# Patient Record
Sex: Male | Born: 1935 | Race: White | Hispanic: No
Health system: Southern US, Community
[De-identification: ages and names within clinical notes are randomized; demographics above are authoritative.]

## PROBLEM LIST (undated history)

## (undated) DIAGNOSIS — K649 Unspecified hemorrhoids: Secondary | ICD-10-CM

## (undated) DIAGNOSIS — K219 Gastro-esophageal reflux disease without esophagitis: Secondary | ICD-10-CM

## (undated) DIAGNOSIS — D369 Benign neoplasm, unspecified site: Secondary | ICD-10-CM

## (undated) DIAGNOSIS — R001 Bradycardia, unspecified: Secondary | ICD-10-CM

## (undated) HISTORY — PX: CATARACT EXTRACTION: SUR2

## (undated) HISTORY — DX: Unspecified hemorrhoids: K64.9

## (undated) HISTORY — DX: Gastro-esophageal reflux disease without esophagitis: K21.9

## (undated) HISTORY — PX: APPENDECTOMY: SHX54

## (undated) HISTORY — DX: Benign neoplasm, unspecified site: D36.9

---

## 2002-01-06 HISTORY — PX: ESOPHAGOGASTRODUODENOSCOPY: SHX1529

## 2002-01-06 HISTORY — PX: COLONOSCOPY: SHX174

## 2002-10-31 ENCOUNTER — Ambulatory Visit (HOSPITAL_COMMUNITY): Admission: RE | Admit: 2002-10-31 | Discharge: 2002-10-31 | Payer: Self-pay | Admitting: Internal Medicine

## 2003-01-07 HISTORY — PX: ESOPHAGOGASTRODUODENOSCOPY: SHX1529

## 2003-01-30 ENCOUNTER — Ambulatory Visit (HOSPITAL_COMMUNITY): Admission: RE | Admit: 2003-01-30 | Discharge: 2003-01-30 | Payer: Self-pay | Admitting: Internal Medicine

## 2004-09-02 ENCOUNTER — Ambulatory Visit: Payer: Self-pay | Admitting: Internal Medicine

## 2006-09-02 ENCOUNTER — Ambulatory Visit: Payer: Self-pay | Admitting: Internal Medicine

## 2007-11-02 ENCOUNTER — Ambulatory Visit (HOSPITAL_COMMUNITY): Admission: RE | Admit: 2007-11-02 | Discharge: 2007-11-02 | Payer: Self-pay | Admitting: Ophthalmology

## 2007-11-16 ENCOUNTER — Ambulatory Visit (HOSPITAL_COMMUNITY): Admission: RE | Admit: 2007-11-16 | Discharge: 2007-11-16 | Payer: Self-pay | Admitting: Ophthalmology

## 2008-10-02 ENCOUNTER — Encounter: Payer: Self-pay | Admitting: Gastroenterology

## 2008-10-09 DIAGNOSIS — K219 Gastro-esophageal reflux disease without esophagitis: Secondary | ICD-10-CM

## 2008-10-09 DIAGNOSIS — I498 Other specified cardiac arrhythmias: Secondary | ICD-10-CM

## 2008-10-09 DIAGNOSIS — K319 Disease of stomach and duodenum, unspecified: Secondary | ICD-10-CM

## 2008-10-10 ENCOUNTER — Ambulatory Visit: Payer: Self-pay | Admitting: Internal Medicine

## 2008-11-06 ENCOUNTER — Emergency Department (HOSPITAL_COMMUNITY): Admission: EM | Admit: 2008-11-06 | Discharge: 2008-11-06 | Payer: Self-pay | Admitting: Emergency Medicine

## 2009-06-19 ENCOUNTER — Ambulatory Visit (HOSPITAL_COMMUNITY): Admission: RE | Admit: 2009-06-19 | Discharge: 2009-06-19 | Payer: Self-pay | Admitting: Ophthalmology

## 2009-07-03 ENCOUNTER — Ambulatory Visit (HOSPITAL_COMMUNITY): Admission: RE | Admit: 2009-07-03 | Discharge: 2009-07-03 | Payer: Self-pay | Admitting: Ophthalmology

## 2010-05-21 NOTE — Assessment & Plan Note (Signed)
[name], [name]                CHART#:  [id number]   DATE:  09/02/2006                       DOB:  Jan 06, 1933   REASON FOR VISIT:  Followup.   HISTORY OF PRESENT ILLNESS:  History of GERD, peptic stricture. Last EGD  with dilation 2004. The patient has done extremely well. He has been on  omeprazole 20 mg daily ever since and reflux symptoms well controlled  and he has not had any recurrent dysphagia. He had an inflammatory polyp  removed from his colon in 2004. He is not due for routine screening  until 2014. He is seeing Dr. Juanetta Gosling on a regular basis. He has  cataracts as his only major medical issue at this time and takes Ambien  for insomnia.   MEDICATIONS:  See updated list.   ALLERGIES:  NO KNOWN DRUG ALLERGIES.   PHYSICAL EXAMINATION:  GENERAL APPEARANCE: On exam today looks well.  VITAL SIGNS: Weight 171 which is down 1 pound, height 6 feet, temp 98.3,  BP 148/80, pulse 60.  SKIN: The skin is warm and dry.  CHEST: The lungs are clear to auscultation.  CARDIAC: Exam has a regular rate and rhythm without murmur, gallop or  rub.  ABDOMEN: The abdomen is nondistended, positive bowel sounds, soft,  nontender without appreciable mass or organomegaly.   ASSESSMENT:  History of gastroesophageal reflux disease with peptic  stricture. He has had long-lasting results from esophageal dilation in  2004, largely because he is staying on acid-suppression therapy.  Admonished him to continue acid-suppression therapy as uncontrolled  reflux is the nidus for recurrent stricture development. He may or may  not need a dilation in the future.   RECOMMENDATIONS:  I have refilled his omeprazole 20 mg tablets daily,  #90 for 2 years unless something comes up. Will touch base with him in 2  years.   ADDENDUM:  Colonoscopy in 2014.       Jonathon Bellows, M.D.  Electronically Signed     RMR/MEDQ  D:  09/02/2006  T:  09/02/2006  Job:  [id number]   cc:   Ramon Dredge L. Juanetta Gosling, M.D.

## 2010-05-24 NOTE — Op Note (Signed)
NAME:  [name], [name]                         ACCOUNT NO.:  [id number]   MEDICAL RECORD NO.:  [id number]                   PATIENT TYPE:  AMB   LOCATION:  DAY                                  FACILITY:  [facility]   PHYSICIAN:  R. Roetta Sessions, M.D.              DATE OF BIRTH:  [birth year]   DATE OF PROCEDURE:  01/30/2003  DATE OF DISCHARGE:                                 OPERATIVE REPORT   PROCEDURE:  Esophagogastroduodenoscopy with biopsy.   INDICATIONS:  The patient is a 77 year old gentleman with history of severe  reflux esophagitis with stricture requiring EGD with __________ dilation  back in October 2004.  He has done very well on Aciphex.  He has not had any  dysphagial reflux symptoms.  I feel he may well have had Barrett's  esophagus; however, biopsies of the distal esophagus were not confirmatory.  Because of the exuberance of the inflammation, I have decided to bring him  back, hopefully in the setting of much less inflammation, to get a better  look at his esophageal mucosa.  This approach has been discussed with the  patient at length.  The potential risks, benefits and alternatives have been  reviewed.  Please see my dictation for more information.   DESCRIPTION OF PROCEDURE:  Oxygen saturation, blood pressure, pulse and  respiration were monitored throughout the entire procedure.  Conscious  sedation with Versed 2 mg IV, Demerol 50 mg IV.  The instrument was the  Olympus __________ gastroscope.   FINDINGS:  Esophagus:  Examination of the tubular esophagus revealed a  couple of tiny distal esophageal erosions.  There was no Barrett's  esophagus, no evidence of neoplasm, ring or stricture.  The EG junction was  patulous, easily traversed.  Stomach:  The gastric cavity was empty and insufflated well with air.  Thorough examination of the gastric mucosa including the retroflexed view of  the proximal stomach and esophagogastric junction demonstrated evidence a  rather  large hiatal hernia and a 5 mm nodule in the cardia just distal to  the EG junction.  Please see photos.  Otherwise gastric mucosa appeared  normal.  Pylorus patent and easily traversed.  Examination of the bulb and  second portion revealed no abnormalities.   THERAPY AND DIAGNOSTIC MANEUVERS:  The 5 mm nodule in the proximal stomach  was cold biopsied/removed.  The patient tolerated the procedure well and was  reactive after endoscopy.   IMPRESSION:  1. Tiny distal esophageal erosions, a couple of very small pseudo     diverticula (not mentioned above) distal esophagus mucosa without any     evidence of Barrett's esophagus.  The esophageal mucosa looked much, much     better today without evidence of Barrett's esophagus.  2. Patulous esophagogastric junction.  3. Nodule in the cardia biopsied.  4. Large hiatal hernia.  5. The remainder of the stomach and D1 and D2 appeared normal.  RECOMMENDATIONS:  1. Continue Aciphex 20 mg orally daily indefinitely.  2. Anti-reflux measures emphasized.  3. Follow up on pathology.  4. Further recommendations to follow.      ___________________________________________                                            Jonathon Bellows, M.D.   RMR/MEDQ  D:  01/30/2003  T:  01/30/2003  Job:  [id number]   cc:   Ramon Dredge L. Juanetta Gosling, M.D.  [address]  [place]  [place] [address]  Fax: [phone]

## 2010-05-24 NOTE — Op Note (Signed)
NAME:  [name], [name]                         ACCOUNT NO.:  [id number]   MEDICAL RECORD NO.:  [id number]                   PATIENT TYPE:  AMB   LOCATION:  DAY                                  FACILITY:  [facility]   PHYSICIAN:  R. Roetta Sessions, M.D.              DATE OF BIRTH:  01-06-1933   DATE OF PROCEDURE:  10/31/2002  DATE OF DISCHARGE:                                 OPERATIVE REPORT   PROCEDURE:  EGD with Elease Hashimoto dilation followed by biopsy followed by  colonoscopy and snare polypectomy.   INDICATIONS FOR PROCEDURE:  The patient is a 77 year old gentleman with  esophageal dysphagia/early satiety, who was found to be Hemoccult positive  recently.  EGD is now being done to further evaluate his upper GI tracts  symptoms.  Colonoscopy is now being done for evaluation of Hemoccult-  positive stool.  It is notable through my office a CBC was entirely normal  as was his TSH at 1.72.  This approach has been discussed with the patient  previously.  Potential risks, benefits, and alternatives have been reviewed,  questions answered.  Please see my dictated consultation note for more  information.   PROCEDURE NOTE:  O2 saturations, blood pressure, pulse, and respirations  were monitored throughout the entirety of the procedure.   CONSCIOUS SEDATION BOTH PROCEDURES:  Demerol 75 mg IV, Versed 4 mg IV in  divided doses.   INSTRUMENT:  Olympus video chip adult gastroscope and colonoscope.   ESOPHAGOGASTRODUODENOSCOPY FINDINGS:  Examination of the tubular esophagus  revealed multiple long distal esophageal erosions superimposed on what  appeared to be long tongues of salmon-colored epithelium, extending 6-7 cm  up into the distal esophagus.  There was also a benign-appearing peptic  stricture.  Please see photos.  EG junction was easily traversed with the  scope.   STOMACH:  The gastric cavity was emptied, insufflated well with air.  Thorough examination of the gastric mucosa, including  retroflexed view of  the proximal stomach and esophagogastric junction demonstrated a moderate  size hiatal hernia.  Pylorus was patent and easily traversed.  Examination  of the bulb and second portion revealed no abnormalities.   THERAPY/DIAGNOSTIC MANEUVERS PERFORMED:  A 56 French Maloney dilator was passed to full insertion.  A look back  revealed the stricture had been dilated without apparent complication.  Biopsies of the salmon-colored epithelium were taken x 2 after passage of  the [facility] dilator.  The patient tolerated the procedure well and was  prepared for colonoscopy.  Digital rectal examination revealed no  abnormalities or endoscopic findings.  The prep was adequate.   RECTUM:  Examination of the rectal mucosa including retroflexed view in the  anal verge revealed no abnormalities.   COLON:  Colonic mucosa was surveyed from the rectosigmoid junction through  the left transverse and right colon to the area of the appendiceal orifice,  ileocecal valve, and cecum.  These  structures were well seen and  photographed for the record.  The patient was noted to have two 5 mm  pedunculated polyps in the right colon, one at the ileocecal valve, one in  the mid ascending colon.  These were cold snared and recovered.  For this  level, the scope was slowly withdrawn.  All previously-mentioned mucosal  surfaces were again seen, and no other abnormalities were observed.  The  patient tolerated both procedures well and was reacted in endoscopy.   IMPRESSION:   EGD:  1. Marked inflammatory changes of the distal esophagus as described above,     consistent with moderately severe erosive reflux esophagitis, tongues of     salmon-colored epithelium underlying the erosions, concerning for     Barrett's esophagus.  2. Peptic stricture, status post dilation as described above.  Distal     esophageal mucosa biopsied following Maloney dilation.  3. Moderate sized hiatal hernia.  4.  Otherwise, normal stomach, normal D1, D2.   COLONOSCOPY FINDINGS:  1. Normal rectum.  2. Small pedunculated polyps in the right colon, cold snared as described     above.  3. Remainder of colonic mucosa appeared normal.   ASSESSMENT/PLAN:  1. The patient has significant gastroesophageal reflux disease.  He needs to     be started on aggressive antiacid regimen in the way of Aciphex 20 mg     orally b.i.d. x 30 days, then drop back to once daily before breakfast     thereafter.  2. Literature on gastroesophageal reflux disease provided to Mr. Sokolov.  3. It was notable before and during the procedure on the cardiac monitor, it     was noted that his PR interval progressively increased over three beats     and then dropped.  This was repeated in sequence while he was on cardiac     monitoring, consistent with a Mobitz type I secondary degree     arteriovenous block.  He was bradycardic in the 40s-50s before and     throughout the procedure and remained asymptomatic.  I recommended he     follow up with Ramon Dredge AL. Juanetta Gosling, M.D. for whatever cardiology     evaluation is felt to be appropriate.  4. Follow up on path when available.  5. We will see this nice gentleman back in the office in three months.     Would give some consideration to taking another look at his esophagus in     three months.      ___________________________________________                                            [name], M.D.   RMR/MEDQ  D:  10/31/2002  T:  10/31/2002  Job:  [id number]   cc:   Ramon Dredge L. Juanetta Gosling, M.D.  [address]  [place]  [place] [address]  Fax: [phone]

## 2010-05-24 NOTE — H&P (Signed)
NAMEGAUDENCIO, CHESNUT                           ACCOUNT NO.:  [id number]   MEDICAL RECORD NO.:  [id number]                  PATIENT TYPE:   LOCATION:                                       FACILITY:   PHYSICIAN:  R. Roetta Sessions, M.D.              DATE OF BIRTH:  31-Jul-1932   DATE OF ADMISSION:  DATE OF DISCHARGE:                                HISTORY & PHYSICAL   CHIEF COMPLAINT:  Early satiety.   HISTORY OF PRESENT ILLNESS:  Mr. Monti Jilek is a 77 year old Caucasian  male who presents to our office for complaints of early satiety as well as  increased belching and flatulence for the last one to two years.  He reports  that he has a good appetite; however, when he starts to eat his meal he only  consumes half before feeling full.  He also reports he has decreased the  size and amount of his meals by about 50% his normal consumption in the last  two years.  He also reports that his weight is down about 8 pounds in the  past six months.  He denies any dysphagia, odynophagia, nausea, vomiting,  reflux, or abdominal pain.  He denies any hematochezia or melena.  He  reports that his bowel movements have been normal which is daily or every-  other day for him and that they are soft and brown.  He denies any problems  with constipation or diarrhea.  He does report that he has had an upper  respiratory infection for about two weeks or so with an associated cough.  However, he reports that this is improving.  He denies any fever or chills.   PAST MEDICAL HISTORY:  1. Bradycardia.  2. Appendectomy.   CURRENT MEDICATIONS:  Denies any.   ALLERGIES:  No known drug allergies.   FAMILY HISTORY:  No known family history of colorectal carcinoma, liver, or  chronic GI problems.  Mother is deceased in her 58s due to diabetes  mellitus.  Father is 91 and live in good health.  He has one sister and one  brother both of which are in good health.   SOCIAL HISTORY:  He has been married for  approximately 50 years and  currently lives with his wife.  He has one grown son who is in good health.  He is currently retired; however, he was a Curator for several years.  He  currently is a nonsmoker; however, he does report a 12-year history of one  to two packs per day which he quit in 1976.  He denies any alcohol or drug  use.   REVIEW OF SYSTEMS:  CONSTITUTIONAL:  See HPI.  He does report good appetite.  CARDIOVASCULAR:  He denies any chest pain or palpitations; however, he does  have history of bradycardia which he reports is essentially asymptomatic  although he does have bouts where his heart rate falls  between 40 and 50  beats per minute.  He is being followed by Dr. Juanetta Gosling for this.  PULMONARY:  He is complaining of a nagging cough that he just cannot get rid of;  however, he has had URI recently within the last week or so and reports that  this is getting somewhat better.  HEENT:  He does have history of being hard-  of-hearing.  GI:  See HPI.  SKIN:  He denies any rash or jaundice.   PHYSICAL EXAMINATION:  VITAL SIGNS:  Weight 156 pounds, height 72 inches,  temperature 98.6, blood pressure 110/80, pulse 80.  GENERAL:  Mr. Dimas Scheck is a 77 year old Caucasian male who is well-  developed, well-nourished, and in no acute distress.  He is alert, pleasant,  oriented, and cooperative.  HEENT:  Sclerae clear and nonicteric.  Conjunctivae pink.  Oropharynx pink  and moist without any lesions.  NECK:  Supple without any mass or thyromegaly.  No JVD.  CHEST:  Heart regular rate and rhythm without murmurs, clicks, rubs, or  gallops.  LUNGS:  There are rhonchi present in the left base; otherwise clear.  ABDOMEN:  Positive bowel sounds x4, soft, nontender, nondistended, with no  palpable organomegaly.  RECTAL:  There is no evidence of external hemorrhoids.  A small amount of  dark brown hard stool is obtained which is heme positive.  EXTREMITIES:  Show 2+ pedal pulses  bilaterally, no pedal edema.   ASSESSMENT:  1. Mr. Merle Cirelli is a 78 year old Caucasian male with worrisome symptoms     including early satiety as well as weight loss in the setting of heme     positive stools.  Given that he has not had history of screening     colonoscopy I discussed with Mr. Witham the fact that we need to pursue     colonoscopy at this point in time.  Also his symptoms of early satiety     associated with a small but persistent weight loss is worrisome and EGD     should be obtained as well to rule out any lesions such as esophageal     carcinoma or peptic ulcer disease.  I discussed both colonoscopy and EGD     with Mr. Charney and also reviewed risks and benefits which include but     are not limited to bleeding, perforation, and infection.  He agrees with     this plan.  Will schedule these procedures with Dr. Jena Gauss as soon as     possible.  2. Cough and upper respiratory symptoms.  He does have rhonchi in the left     base as well.  Will obtain CBC today as we would anyway for heme positive     stools and may need to proceed with chest x-ray if symptoms persist or if     he has leukocytosis.  Will encourage him to follow up with Dr. Juanetta Gosling as     well if symptoms persist.   RECOMMENDATIONS:  1. CBC and TSH today.  2. EGD and colonoscopy to be scheduled with Dr. Jena Gauss.  Consent will be     obtained.  3. Further recommendations following procedures.     _____________________________________  ___________________________________________  Nicholas Lose, N.P.               Jonathon Bellows, M.D.   KC/MEDQ  D:  10/28/2002  T:  10/28/2002  Job:  [id number]   cc:   Ramon Dredge L. Juanetta Gosling, M.D.  [address]  [place]  [place] [address]  Fax: [phone]

## 2010-07-19 ENCOUNTER — Other Ambulatory Visit: Payer: Self-pay | Admitting: Internal Medicine

## 2010-07-22 NOTE — Telephone Encounter (Signed)
Called in rx. Pt needs to be seen Oct 2012 for 2 year f/u.

## 2010-09-27 ENCOUNTER — Encounter: Payer: Self-pay | Admitting: Internal Medicine

## 2010-10-08 LAB — BASIC METABOLIC PANEL
BUN: 6
CO2: 27
Calcium: 9.1
Creatinine, Ser: 0.88
GFR calc non Af Amer: >60
Glucose, Bld: 88
Sodium: 141

## 2010-10-16 ENCOUNTER — Encounter: Payer: Self-pay | Admitting: Gastroenterology

## 2010-10-16 ENCOUNTER — Ambulatory Visit (INDEPENDENT_AMBULATORY_CARE_PROVIDER_SITE_OTHER): Payer: Medicare Other | Admitting: Gastroenterology

## 2010-10-16 VITALS — BP 126/63 | HR 68 | Temp 97.3°F | Ht 72.0 in | Wt 169.6 lb

## 2010-10-16 DIAGNOSIS — K219 Gastro-esophageal reflux disease without esophagitis: Secondary | ICD-10-CM

## 2010-10-16 MED ORDER — OMEPRAZOLE 20 MG PO CPDR: 20.0000 mg | DELAYED_RELEASE_CAPSULE | Freq: Every day | ORAL | Status: DC

## 2010-10-16 NOTE — Patient Instructions (Signed)
You will be due for your colonoscopy in 11/2012. We will call to make your next appointment around 10/2012.

## 2010-10-16 NOTE — Progress Notes (Signed)
Primary Care Physician: Fredirick Maudlin, MD  Primary Gastroenterologist:  Roetta Sessions, MD   Chief Complaint  Patient presents with  . Follow-up    HPI: [name] is a 77 y.o. male here for followup of GERD. He takes omeprazole 20 mg daily. Good control of heartburn. He tries not to over eat. Denies dysphagia, odynophagia, early satiety, abdominal pain, constipation, diarrhea, melena, rectal bleeding. Since we last saw him he had cataracts removed from both eyes. This is complicated by scar tissue which required laser surgery. He is pleased with his outcome. He now has hearing aids as well.  Current Outpatient Prescriptions  Medication Sig Dispense Refill  . omeprazole (PRILOSEC) 20 MG capsule TAKE 1 CAPSULE BY MOUTH EVERY MORNING.  31 capsule  3  . zolpidem (AMBIEN) 10 MG tablet         Allergies as of 10/16/2010  . (No Known Allergies)    ROS:  General: Negative for anorexia, weight loss, fever, chills, fatigue, weakness. ENT: Negative for hoarseness, difficulty swallowing , nasal congestion. CV: Negative for chest pain, angina, palpitations, dyspnea on exertion, peripheral edema.  Respiratory: Negative for dyspnea at rest, dyspnea on exertion, cough, sputum, wheezing.  GI: See history of present illness. GU:  Negative for dysuria, hematuria, urinary incontinence, urinary frequency, nocturnal urination.  Endo: Negative for unusual weight change.    Physical Examination:   BP 126/63  Pulse 68  Temp(Src) 97.3 F (36.3 C) (Temporal)  Ht 6' (1.829 m)  Wt 169 lb 9.6 oz (76.93 kg)  BMI 23.00 kg/m2  General: Well-nourished, well-developed in no acute distress.  Eyes: No icterus. Mouth: Oropharyngeal mucosa moist and pink , no lesions erythema or exudate. Lungs: Clear to auscultation bilaterally.  Heart: Regular rate and rhythm, no murmurs rubs or gallops.  Abdomen: Bowel sounds are normal, nontender, nondistended, no hepatosplenomegaly or masses, no abdominal bruits  or hernia , no rebound or guarding.   Extremities: No lower extremity edema. No clubbing or deformities. Neuro: Alert and oriented x 4   Skin: Warm and dry, no jaundice.   Psych: Alert and cooperative, normal mood and affect.

## 2010-10-16 NOTE — Assessment & Plan Note (Addendum)
Clinically asymptomatic on omeprazole 20 mg daily. Continue anti-reflex measures. Come back to be seen in 2 years. At that time we'll plan to schedule your colonoscopy. We will renew your omeprazole in one year when it comes time.

## 2010-10-16 NOTE — Progress Notes (Signed)
Cc to PCP 

## 2010-12-09 ENCOUNTER — Emergency Department (HOSPITAL_COMMUNITY)
Admission: EM | Admit: 2010-12-09 | Discharge: 2010-12-09 | Disposition: A | Discharge status: Home / Self Care | Payer: Medicare Other | Attending: Emergency Medicine | Admitting: Emergency Medicine

## 2010-12-09 ENCOUNTER — Encounter (HOSPITAL_COMMUNITY): Payer: Self-pay | Admitting: Emergency Medicine

## 2010-12-09 ENCOUNTER — Emergency Department (HOSPITAL_COMMUNITY): Payer: Medicare Other

## 2010-12-09 DIAGNOSIS — Z23 Encounter for immunization: Secondary | ICD-10-CM | POA: Insufficient documentation

## 2010-12-09 DIAGNOSIS — W230XXA Caught, crushed, jammed, or pinched between moving objects, initial encounter: Secondary | ICD-10-CM | POA: Insufficient documentation

## 2010-12-09 DIAGNOSIS — K219 Gastro-esophageal reflux disease without esophagitis: Secondary | ICD-10-CM | POA: Insufficient documentation

## 2010-12-09 DIAGNOSIS — Y92009 Unspecified place in unspecified non-institutional (private) residence as the place of occurrence of the external cause: Secondary | ICD-10-CM | POA: Insufficient documentation

## 2010-12-09 DIAGNOSIS — S62609B Fracture of unspecified phalanx of unspecified finger, initial encounter for open fracture: Secondary | ICD-10-CM | POA: Insufficient documentation

## 2010-12-09 HISTORY — DX: Bradycardia, unspecified: R00.1

## 2010-12-09 MED ORDER — HYDROCODONE-ACETAMINOPHEN 7.5-325 MG PO TABS: 1.0000 | ORAL_TABLET | ORAL | Status: DC | PRN

## 2010-12-09 MED ORDER — CEPHALEXIN 500 MG PO CAPS: 500.0000 mg | ORAL_CAPSULE | Freq: Four times a day (QID) | ORAL | Status: DC

## 2010-12-09 MED ORDER — BUPIVACAINE HCL (PF) 0.5 % IJ SOLN
30.0000 mL | Freq: Once | INTRAMUSCULAR | Status: AC
  Administered 2010-12-09: 30 mL
  Filled 2010-12-09: qty 30

## 2010-12-09 MED ORDER — TETANUS-DIPHTH-ACELL PERTUSSIS 5-2.5-18.5 LF-MCG/0.5 IM SUSP
0.5000 mL | Freq: Once | INTRAMUSCULAR | Status: AC
  Administered 2010-12-09: 0.5 mL via INTRAMUSCULAR
  Filled 2010-12-09: qty 0.5
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
## 2010-12-09 NOTE — ED Provider Notes (Signed)
History     CSN: [id number] Arrival date & time: 12/09/2010  9:53 AM   First MD Initiated Contact with Patient 12/09/10 1038      Chief Complaint  Patient presents with  . Hand Injury    (Consider location/radiation/quality/duration/timing/severity/associated sxs/prior treatment) Patient is a 77 y.o. male presenting with hand injury. The history is provided by the patient.  Hand Injury  The incident occurred 1 to 2 hours ago. The incident occurred at home. Injury mechanism: Pt was working on a hand crank motor at home and got his hand caught in a moving part. The pain is present in the right fingers. The quality of the pain is described as aching. The pain is moderate. The pain has been constant since the incident. Pertinent negatives include no fever and no malaise/fatigue. He reports no foreign bodies present. The symptoms are aggravated by movement. He has tried nothing for the symptoms. The treatment provided no relief.    Past Medical History  Diagnosis Date  . GERD (gastroesophageal reflux disease)   . Bradycardia     Past Surgical History  Procedure Date  . Appendectomy   . Esophagogastroduodenoscopy 2005    Dr. 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 Esophagogastroduodenoscopy 2004    peptic stricture s/p dilation, moderately severe reflux esophagitis  . Colonoscopy 2004    two inflammatory polyps, next TCS 11/2012  . Cataract extraction     bilateral    Family History  Problem Relation Age of Onset  . Heart disease Mother     deceased age 37    History  Substance Use Topics  . Smoking status: Never Smoker   . Smokeless tobacco: Not on file  . Alcohol Use: No      Review of Systems  Constitutional: Negative for fever, malaise/fatigue and activity change.       All ROS Neg except as noted in HPI  HENT: Negative for nosebleeds and neck pain.   Eyes: Negative for photophobia and discharge.  Respiratory: Negative for cough,  shortness of breath and wheezing.   Cardiovascular: Negative for chest pain and palpitations.  Gastrointestinal: Negative for abdominal pain and blood in stool.  Genitourinary: Negative for dysuria, frequency and hematuria.  Musculoskeletal: Negative for back pain and arthralgias.  Skin: Negative.   Neurological: Negative for dizziness, seizures and speech difficulty.  Psychiatric/Behavioral: Positive for sleep disturbance. Negative for hallucinations and confusion.    Allergies  Review of patient's allergies indicates no known allergies.  Home Medications   Current Outpatient Rx  Name Route Sig Dispense Refill  . OMEPRAZOLE 20 MG PO CPDR Oral Take 1 capsule (20 mg total) by mouth daily. 31 capsule 11  . ZOLPIDEM TARTRATE 10 MG PO TABS Oral Take 10 mg by mouth at bedtime as needed. For sleep      BP 127/82  Pulse 93  Temp 98.5 F (36.9 C)  Resp 20  Ht 6' (1.829 m)  Wt 175 lb (79.379 kg)  BMI 23.73 kg/m2  SpO2 98%  Physical Exam  Nursing note and vitals reviewed. Constitutional: He is oriented to person, place, and time. He appears well-developed and well-nourished.  Non-toxic appearance.  HENT:  Head: Normocephalic.  Right Ear: Tympanic membrane and external ear normal.  Left Ear: Tympanic membrane and external ear normal.  Eyes: EOM and lids are normal. Pupils are equal, round, and reactive to light.  Neck: Normal range of motion. Neck supple. Carotid bruit is not present.  Cardiovascular: Normal  rate, regular rhythm, normal heart sounds, intact distal pulses and normal pulses.   Pulmonary/Chest: Breath sounds normal. No respiratory distress.  Abdominal: Soft. Bowel sounds are normal. There is no tenderness. There is no guarding.  Musculoskeletal: Normal range of motion.       FROM of the right wrist, thumb, and 2nd finger. 1/2 of the nail of the the 3rd finger partially removed. Nail bed exposed. Some bleeding. No bone exposure.Marland Kitchen FROM of the 3rd finger. The nail of the  4th finger has been traumatically removed. FROM of the right 4th finger. Pain to palpation of the distal finger and attempted movement of the DIP. Laceration at the tip of the right 5th finger. Bleeding mostly controlled. No bone exposure or tendon involvement. Pain to palpation of the rt 5th distal portion of the finger.   Lymphadenopathy:       Head (right side): No submandibular adenopathy present.       Head (left side): No submandibular adenopathy present.    He has no cervical adenopathy.  Neurological: He is alert and oriented to person, place, and time. He has normal strength. No cranial nerve deficit or sensory deficit.  Skin: Skin is warm and dry.  Psychiatric: He has a normal mood and affect. His speech is normal.    ED Course  LACERATION REPAIR Performed by: Kathie Dike Authorized by: Kathie Dike Consent: Verbal consent obtained. Risks and benefits: risks, benefits and alternatives were discussed Consent given by: patient Patient understanding: patient states understanding of the procedure being performed Patient identity confirmed: verbally with patient Time out: Immediately prior to procedure a "time out" was called to verify the correct patient, procedure, equipment, support staff and site/side marked as required. Body area: upper extremity Location details: right small finger Laceration length: 1 cm Tendon involvement: none Nerve involvement: none Vascular damage: no Anesthesia: digital block Local anesthetic: bupivacaine 0.5% without epinephrine Patient sedated: no Preparation: Patient was prepped and draped in the usual sterile fashion. Irrigation solution: saline Amount of cleaning: standard Debridement: none Skin closure: 3-0 nylon Number of sutures: 2 Technique: simple Approximation: close Approximation difficulty: simple Dressing: 4x4 sterile gauze Patient tolerance: Patient tolerated the procedure well with no immediate complications. Comments:  Sterile Dressing applied by me.  NAIL REMOVAL Performed by: Kathie Dike Authorized by: Kathie Dike Consent: Verbal consent obtained. Risks and benefits: risks, benefits and alternatives were discussed Consent given by: patient Patient understanding: patient states understanding of the procedure being performed Patient identity confirmed: verbally with patient Time out: Immediately prior to procedure a "time out" was called to verify the correct patient, procedure, equipment, support staff and site/side marked as required. Location: right hand Location details: right long finger Anesthesia: digital block Local anesthetic: bupivacaine 0.5% without epinephrine Patient sedated: no Preparation: skin prepped with Betadine and sterile field established Amount removed: 1/2 Nail bed sutured: no Dressing: Xeroform gauze Patient tolerance: Patient tolerated the procedure well with no immediate complications. Comments: No significant laceration of the nail bed noted. Pt has a partial removal the the nail of the right 3rd finger. Non stick dressing applied by me.   (including critical care time) FRACTURE CARE:The pt was educated on the fractures of the distal portion of the 4th and 5th fingers. The fifth finger being considered open due to laceration in the fracture area. The right 4th and 5th fingers were buddy taped and splinted with cling and co-ban dressing by me.Pain control, antibiotic therapy and orthopedic out patient evaluation  discussed with the patient.  Labs Reviewed - No data to display Dg Hand Complete Right  12/09/2010  *RADIOLOGY REPORT*  Clinical Data: Trauma.  Distal phalanges caught crank motor  RIGHT HAND - COMPLETE 3+ VIEW  Comparison: None  Findings: There is a nondisplaced fracture deformity which extends to the tip of the tuft of the fifth distal phalanx.  On the AP and oblique radiographs there is a lucency which extends through the base of the tuft of the fourth  distal phalanx.  Soft tissue irregularity overlies the distal aspects of the third through fifth distal phalanges.  IMPRESSION:  1.  Tuftal fracture involves the fifth distal phalanx. 2.  Indeterminant lucency extending through the base of the tuft of the fourth distal phalanx.  Cannot rule out nondisplaced fracture.  Original Report Authenticated By: Rosealee Albee, M.D.     Dx:1. Open fracture of the distal right 5th finger. 2. Fracture of the distal right  4th finger. 3. Traumatic avulsion of the nail of the right 4th and 3rd finger.   MDM  I have reviewed nursing notes, vital signs, and all appropriate lab and imaging results for this patient.        Kathie Dike, [place] 12/09/10 [phone]

## 2010-12-10 ENCOUNTER — Ambulatory Visit (INDEPENDENT_AMBULATORY_CARE_PROVIDER_SITE_OTHER): Payer: Medicare Other | Admitting: Orthopedic Surgery

## 2010-12-10 ENCOUNTER — Encounter: Payer: Self-pay | Admitting: Orthopedic Surgery

## 2010-12-10 VITALS — BP 120/80 | Ht 72.0 in | Wt 168.0 lb

## 2010-12-10 DIAGNOSIS — S61209A Unspecified open wound of unspecified finger without damage to nail, initial encounter: Secondary | ICD-10-CM

## 2010-12-10 MED ORDER — HYDROCODONE-ACETAMINOPHEN 7.5-325 MG PO TABS: 1.0000 | ORAL_TABLET | ORAL | Status: DC | PRN

## 2010-12-10 NOTE — Progress Notes (Signed)
Chief complaint: Fingertip injuries RIGHT hand, small, ring, and long finger HPI:(4) Injury date December 2 secondary to fingers caught in a flywheel. Initial evaluation at the emergency room. X-ray showed a small fractures in the tips of the fingers, nondisplaced. Started on cephalexin and hydrocodone 7.5. Complains of 3-4/10 pain tends to come and go. Nothing is making a better at this time is worse if it gets up against something. He does feel some tingling.  ROS:(2) His eyes were water is other 13 systems are normal  PFSH: (1) History of appendectomy eye implants. Denies any major medical problems  Physical Exam(12) GENERAL: normal development   CDV: pulses are normal   Skin: normal  Lymph: nodes were not palpable/normal  Psychiatric: awake, alert and oriented  Neuro: normal sensation  MSK Ambulation without assistance 1 RIGHT hand after dressings are removed. We note the ring finger has 2 stitches in the nail. No deformity. Normal range of motion. 2 RIGHT ring finger nail completely avulsed. The nailbed normal. Range of motion normal. 3 RIGHT long finger partial nail avulsed. Alignment normal. Joint motion normal.   Imaging: The films were done at the hospital. They show small fractures of the distal phalanges of the involved digit  Assessment: Assessment is nail tip injuries. Plan dressing changes, antibiotics    Plan: Reevaluate in one week.

## 2010-12-10 NOTE — Patient Instructions (Signed)
Change dressing daily   Apply neosporin 3 x a day to small finger

## 2010-12-10 NOTE — ED Provider Notes (Signed)
Medical screening examination/treatment/procedure(s) were performed by non-physician practitioner and as supervising physician I was immediately available for consultation/collaboration.  Nicoletta Dress. Colon Branch, MD 12/10/10 [phone]

## 2010-12-17 ENCOUNTER — Encounter: Payer: Self-pay | Admitting: Orthopedic Surgery

## 2010-12-17 ENCOUNTER — Ambulatory Visit (INDEPENDENT_AMBULATORY_CARE_PROVIDER_SITE_OTHER): Payer: Medicare Other | Admitting: Orthopedic Surgery

## 2010-12-17 VITALS — BP 100/62 | Ht 72.0 in | Wt 169.0 lb

## 2010-12-17 DIAGNOSIS — S61209A Unspecified open wound of unspecified finger without damage to nail, initial encounter: Secondary | ICD-10-CM

## 2010-12-17 MED ORDER — HYDROCODONE-ACETAMINOPHEN 7.5-325 MG PO TABS: 1.0000 | ORAL_TABLET | ORAL | Status: AC | PRN

## 2010-12-17 NOTE — Progress Notes (Signed)
Routine scheduled followup  Previous history: Injury date December 2 secondary to fingers caught in a flywheel. Initial evaluation at the emergency room. X-ray showed a small fractures in the tips of the fingers, nondisplaced. Started on cephalexin and hydrocodone 7.5. Complains of 3-4/10 pain tends to come and go. Nothing is making a better at this time is worse if it gets up against something. He does feel some tingling.  Wound is clean and all fingers  He does compare some pain in the ring finger  Recommended continued dressing changes followup in several weeks to check on the progression of the nail and healing of the injuries.

## 2010-12-17 NOTE — Patient Instructions (Signed)
Keep covered until nail grow back

## 2011-01-23 ENCOUNTER — Ambulatory Visit: Payer: Medicare Other | Admitting: Orthopedic Surgery

## 2011-10-07 ENCOUNTER — Other Ambulatory Visit: Payer: Self-pay | Admitting: Internal Medicine

## 2011-10-07 NOTE — Telephone Encounter (Signed)
Pt came to front window asking if we could call in his RF of Omeprazole20 mg to [facility] and to have them deliver it to his house today or tomorrow. He does not want to use [facility]. Pt is on recall to follow up with [facility] next Sept 2014

## 2012-01-27 ENCOUNTER — Ambulatory Visit (INDEPENDENT_AMBULATORY_CARE_PROVIDER_SITE_OTHER): Payer: Medicare Other | Admitting: Gastroenterology

## 2012-01-27 ENCOUNTER — Encounter: Payer: Self-pay | Admitting: Gastroenterology

## 2012-01-27 VITALS — BP 118/70 | HR 84 | Temp 97.4°F | Ht 72.0 in | Wt 158.8 lb

## 2012-01-27 DIAGNOSIS — K219 Gastro-esophageal reflux disease without esophagitis: Secondary | ICD-10-CM

## 2012-01-27 DIAGNOSIS — Z1211 Encounter for screening for malignant neoplasm of colon: Secondary | ICD-10-CM

## 2012-01-27 MED ORDER — PANTOPRAZOLE SODIUM 20 MG PO TBEC: 40.0000 mg | DELAYED_RELEASE_TABLET | Freq: Two times a day (BID) | ORAL | Status: DC

## 2012-01-27 NOTE — Patient Instructions (Addendum)
Stop Prilosec.  Start taking Protonix twice a day. Take this 30 minutes before breakfast and 30 minutes before dinner.   Call us in about a week. If you are not improved, we will need to proceed with an upper endoscopy to look at your esophagus and stomach.   Please review the reflux diet in the meantime.  Diet for Gastroesophageal Reflux Disease, Adult Reflux (acid reflux) is when acid from your stomach flows up into the esophagus. When acid comes in contact with the esophagus, the acid causes irritation and soreness (inflammation) in the esophagus. When reflux happens often or so severely that it causes damage to the esophagus, it is called gastroesophageal reflux disease (GERD). Nutrition therapy can help ease the discomfort of GERD. FOODS OR DRINKS TO AVOID OR LIMIT  Smoking or chewing tobacco. Nicotine is one of the most potent stimulants to acid production in the gastrointestinal tract.   Caffeinated and decaffeinated coffee and black tea.   Regular or low-calorie carbonated beverages or energy drinks (caffeine-free carbonated beverages are allowed).     Strong spices, such as black pepper, white pepper, red pepper, cayenne, curry powder, and chili powder.   Peppermint or spearmint.   Chocolate.   High-fat foods, including meats and fried foods. Extra added fats including oils, butter, salad dressings, and nuts. Limit these to less than 8 tsp per day.   Fruits and vegetables if they are not tolerated, such as citrus fruits or tomatoes.   Alcohol.   Any food that seems to aggravate your condition.  If you have questions regarding your diet, call your caregiver or a registered dietitian. OTHER THINGS THAT MAY HELP GERD INCLUDE:    Eating your meals slowly, in a relaxed setting.   Eating 5 to 6 small meals per day instead of 3 large meals.   Eliminating food for a period of time if it causes distress.   Not lying down until 3 hours after eating a meal.   Keeping the head  of your bed raised 6 to 9 inches (15 to 23 cm) by using a foam wedge or blocks under the legs of the bed. Lying flat may make symptoms worse.   Being physically active. Weight loss may be helpful in reducing reflux in overweight or obese adults.   Wear loose fitting clothing  EXAMPLE MEAL PLAN This meal plan is approximately 2,000 calories based on https://www.[facility].org/ meal planning guidelines. Breakfast   cup cooked oatmeal.   1 cup strawberries.   1 cup low-fat milk.   1 oz almonds.  Snack  1 cup cucumber slices.   6 oz yogurt (made from low-fat or fat-free milk).  Lunch  2 slice whole-wheat bread.   2 oz sliced [place].   2 tsp mayonnaise.   1 cup blueberries.   1 cup snap peas.  Snack  6 whole-wheat crackers.   1 oz string cheese.  Dinner   cup brown rice.   1 cup mixed veggies.   1 tsp olive oil.   3 oz grilled fish.  Document Released: 12/23/2004 Document Revised: 03/17/2011 Document Reviewed: 11/08/2010 [facility] Patient Information 2013 [place], [place].

## 2012-01-27 NOTE — Progress Notes (Signed)
Referring Provider: Fredirick Maudlin, MD Primary Care Physician:  Fredirick Maudlin, MD Primary Gastroenterologist: Dr. Jena Gauss   Chief Complaint  Patient presents with  . Follow-up    HPI:   Pleasant 79 year old male who presents today secondary to GERD. Last seen in October 2012.  Taking Prilosec TID instead of once daily. States cough drops help as well. States helps to coat his throat. States it feels like food wants to back up, but it is not as bad as in the past. Points to throat, stating "something upside down". Improved. Feels heated in esophagus. No odynophagia. No N/V. No abx recently. No steroids. +early satiety, not eating as much for a few days, since this episode started.   Last EGD in 2005 with Dr. Jena Gauss; esophagitis and leiomyoma, large hiatal hernia. Hx of peptic stricture, s/p dilation in 2004.   Past Medical History  Diagnosis Date  . GERD (gastroesophageal reflux disease)   . Bradycardia     Past Surgical History  Procedure Date  . Appendectomy   . Esophagogastroduodenoscopy 2005    Dr. 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 Esophagogastroduodenoscopy 2004    peptic stricture s/p dilation, moderately severe reflux esophagitis  . Colonoscopy 2004    two inflammatory polyps, next TCS 11/2012  . Cataract extraction     bilateral    Current Outpatient Prescriptions  Medication Sig Dispense Refill  . omeprazole (PRILOSEC) 20 MG capsule Take 1 capsule (20 mg total) by mouth daily.  31 capsule  11  . zolpidem (AMBIEN) 10 MG tablet Take 10 mg by mouth at bedtime as needed. For sleep        Allergies as of 01/27/2012  . (No Known Allergies)    Family History  Problem Relation Age of Onset  . Heart disease Mother     deceased age 56  . Diabetes      History   Social History  . Marital Status: Married    Spouse Name: N/A    Number of Children: N/A  . Years of Education: N/A   Occupational History  . retired     Social History Main Topics  . Smoking status: Never Smoker   . Smokeless tobacco: None  . Alcohol Use: No  . Drug Use: No  . Sexually Active: None   Other Topics Concern  . None   Social History Narrative  . None    Review of Systems: Gen: SEE HPI CV: Denies chest pain, palpitations, syncope, peripheral edema, and claudication. Resp: Denies dyspnea at rest, cough, wheezing, coughing up blood, and pleurisy. GI: SEE HPI Derm: Denies rash, itching, dry skin Psych: Denies depression, anxiety, memory loss, confusion. No homicidal or suicidal ideation.  Heme: Denies bruising, bleeding, and enlarged lymph nodes.  Physical Exam: BP 118/70  Pulse 84  Temp 97.4 F (36.3 C) (Oral)  Ht 6' (1.829 m)  Wt 158 lb 12.8 oz (72.031 kg)  BMI 21.54 kg/m2 General:   Alert and oriented. No distress noted. Pleasant and cooperative.  Head:  Normocephalic and atraumatic. Eyes:  Conjuctiva clear without scleral icterus. EARS: HOH Mouth:  Oral mucosa pink and moist. Good dentition. No lesions. Neck:  Supple, without mass or thyromegaly. Heart:  S1, S2 present without murmurs, rubs, or gallops. Regular rate and rhythm. Abdomen:  +BS, soft, non-tender and non-distended. No rebound or guarding. No HSM or masses noted. Msk:  Symmetrical without gross deformities. Normal posture. Extremities:  Without edema. Neurologic:  Alert and  oriented  x4;  grossly normal neurologically. Skin:  Intact without significant lesions or rashes. Cervical Nodes:  No significant cervical adenopathy. Psych:  Alert and cooperative. Normal mood and affect.

## 2012-01-28 ENCOUNTER — Other Ambulatory Visit: Payer: Self-pay | Admitting: Gastroenterology

## 2012-01-28 ENCOUNTER — Telehealth: Payer: Self-pay | Admitting: Gastroenterology

## 2012-01-28 MED ORDER — PANTOPRAZOLE SODIUM 40 MG PO TBEC: 40.0000 mg | DELAYED_RELEASE_TABLET | Freq: Two times a day (BID) | ORAL | Status: DC

## 2012-01-28 MED ORDER — PANTOPRAZOLE SODIUM 20 MG PO TBEC: 40.0000 mg | DELAYED_RELEASE_TABLET | Freq: Two times a day (BID) | ORAL | Status: DC

## 2012-01-28 NOTE — Telephone Encounter (Signed)
Tried to call pharmacy, phone number was busy. Called pt and informed him that we fixed rx and sent it again. It was in epic as an inpatient rx.  He said he would check with the pharmacy.

## 2012-01-28 NOTE — Telephone Encounter (Signed)
Pt called to say that [facility] in [place] has not received his prescription and could we check on that and call him back to let him know. [phone] Pt was seen yesterday by AS

## 2012-01-28 NOTE — Telephone Encounter (Signed)
It looks like it went through. Can we check on this? I sent it again.

## 2012-01-29 DIAGNOSIS — Z1211 Encounter for screening for malignant neoplasm of colon: Secondary | ICD-10-CM | POA: Insufficient documentation

## 2012-01-29 DIAGNOSIS — K219 Gastro-esophageal reflux disease without esophagitis: Secondary | ICD-10-CM | POA: Insufficient documentation

## 2012-01-29 NOTE — Progress Notes (Signed)
Faxed to PCP

## 2012-01-29 NOTE — Assessment & Plan Note (Signed)
79 year old male with worsening GERD, increasing Prilosec to three times a day in the last week. Questionable dysphagia. Hx of peptic stricture in 2004, last EGD in 2005 with esophagitis. I have changed his PPI to Protonix BID to see if any improvement. I have discussed the possibility of an upper endoscopy +/- dilation if necessary in the near future. He is to call us in 1 week with a progress report. If no improvement, proceed with EGD.

## 2012-01-29 NOTE — Assessment & Plan Note (Signed)
Due for colonoscopy in Nov 2014

## 2012-01-30 ENCOUNTER — Ambulatory Visit: Payer: Medicare Other | Admitting: Internal Medicine

## 2012-02-03 ENCOUNTER — Telehealth: Payer: Self-pay

## 2012-02-03 NOTE — Telephone Encounter (Signed)
Per Gerrit Halls, NP, who saw pt last week on 01/27/2012. I called to check on the patient to see how he is doing. He said he is doing better. He is taking the Protonix bid. He is not having difficulty swallowing. Sometimes it feels like food does back a little but he is much better. He does seem that he gets full quicker. I told him we will call back when Theressa Millard this info.

## 2012-02-04 NOTE — Telephone Encounter (Signed)
Called pt and he is scheduled for OV with AS on 03/03/2012 at 1:30 PM.

## 2012-02-04 NOTE — Telephone Encounter (Signed)
I'm glad to hear he is improved. I'd like to see him in about 4-6 weeks. i want to make sure his weight is stable and that he is continuing to do well. IF any dysphagia, he will need EGD/ED. Hx of peptic stricture.

## 2012-03-03 ENCOUNTER — Encounter: Payer: Self-pay | Admitting: Gastroenterology

## 2012-03-03 ENCOUNTER — Ambulatory Visit (INDEPENDENT_AMBULATORY_CARE_PROVIDER_SITE_OTHER): Payer: Medicare Other | Admitting: Gastroenterology

## 2012-03-03 VITALS — BP 125/80 | HR 85 | Temp 97.3°F | Ht 68.0 in | Wt 165.2 lb

## 2012-03-03 DIAGNOSIS — K219 Gastro-esophageal reflux disease without esophagitis: Secondary | ICD-10-CM

## 2012-03-03 MED ORDER — OMEPRAZOLE 20 MG PO CPDR: 20.0000 mg | DELAYED_RELEASE_CAPSULE | Freq: Every day | ORAL | Status: DC

## 2012-03-03 NOTE — Progress Notes (Signed)
Referring Provider: Fredirick Maudlin, MD Primary Care Physician:  Fredirick Maudlin, MD Primary Gastroenterologist: Dr. Jena Gauss  Chief Complaint  Patient presents with  . Follow-up    HPI:   [name] returns today in follow-up for GERD and dysphagia. At his last appointment late Jan 2014, I changed him from Prilosec to Protonix BID. He had vague symptoms of dysphagia. Question of perhaps uncontrolled GERD. Last EGD in 2005 with Dr. Jena Gauss; esophagitis and leiomyoma, large hiatal hernia. Hx of peptic stricture, s/p dilation in 2004. He is due for surveillance colonoscopy in Nov 2014. He contacted [place] with a progress report about a week after his last visit, and he had improved with switching PPIs. Presents today stating the Protonix BID "burned him up". He went back to Prilosec BID and states his symptoms are completely improved. Denies dysphagia. Ate pinto beans, salad, corn bread and burped a few times otherwise was fine. Burping after each meal. States chronic. No nausea. Appetite good. "eats too much". States he saw blood in his urine after taking Protonix. No urinary symptoms, everything back to normal. States he has cut out pepsi.   Past Medical History  Diagnosis Date  . GERD (gastroesophageal reflux disease)   . Bradycardia     Past Surgical History  Procedure Laterality Date  . Appendectomy    . Esophagogastroduodenoscopy  2005    Dr. 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 Esophagogastroduodenoscopy  2004    peptic stricture s/p dilation, moderately severe reflux esophagitis  . Colonoscopy  2004    two inflammatory polyps, next TCS 11/2012  . Cataract extraction      bilateral    Current Outpatient Prescriptions  Medication Sig Dispense Refill  . omeprazole (PRILOSEC) 20 MG capsule Take 1 capsule (20 mg total) by mouth daily.  60 capsule  11  . zolpidem (AMBIEN) 10 MG tablet Take 10 mg by mouth at bedtime as needed. For sleep       No current  facility-administered medications for this visit.    Allergies as of 03/03/2012  . (No Known Allergies)    Family History  Problem Relation Age of Onset  . Heart disease Mother     deceased age 59  . Diabetes Mother   . Colon cancer Neg Hx     History   Social History  . Marital Status: Married    Spouse Name: N/A    Number of Children: N/A  . Years of Education: N/A   Occupational History  . retired    Social History Main Topics  . Smoking status: Never Smoker   . Smokeless tobacco: None  . Alcohol Use: No  . Drug Use: No  . Sexually Active: None   Other Topics Concern  . None   Social History Narrative  . None    Review of Systems: Negative unless mentioned in HPI  Physical Exam: BP 125/80  Pulse 85  Temp(Src) 97.3 F (36.3 C) (Oral)  Ht 5\' 8"  (1.727 m)  Wt 165 lb 3.2 oz (74.934 kg)  BMI 25.12 kg/m2 General:   Alert and oriented. No distress noted. Pleasant and cooperative.  Head:  Normocephalic and atraumatic. Eyes:  Conjuctiva clear without scleral icterus. Mouth:  Oral mucosa pink and moist. Good dentition. No lesions. Heart:  S1, S2 present without murmurs, rubs, or gallops. Regular rate and rhythm. Abdomen:  +BS, soft, non-tender and non-distended. No rebound or guarding. No HSM or masses noted. Msk:  Symmetrical without gross  deformities. Normal posture. Extremities:  Without edema. Neurologic:  Alert and  oriented x4;  grossly normal neurologically. Skin:  Intact without significant lesions or rashes. Psych:  Alert and cooperative. Normal mood and affect.

## 2012-03-03 NOTE — Assessment & Plan Note (Signed)
79 year old male with history of GERD, recent aggravation of symptoms but now back to baseline. He did not tolerate Protonix BID and actually went back to Prilosec BID. He states he has also cut out Pepsi, which probably was a large contributor to his symptoms. Dysphagia resolved. He does have a history of a peptic stricture. I discussed with him the importance of contacting us if any further issues. Otherwise, continue Prilosec BID, return in October 2014 to set up routine surveillance colonoscopy with Dr. Jena Gauss.  GERD handout diet provided. He will also be seeing Dr. Juanetta Gosling in the near future. I have asked him to discuss his recent gross blood in urine; this has resolved at the time of this visit, no urinary symptoms.

## 2012-03-03 NOTE — Patient Instructions (Addendum)
Continue to take Prilosec twice a day, 30 minutes before breakfast and supper.  Review the reflux diet. Avoid Pepsi and continue to drink that good ice water!   We will see you in October to set up your colonoscopy.   Diet for Gastroesophageal Reflux Disease, Adult Reflux (acid reflux) is when acid from your stomach flows up into the esophagus. When acid comes in contact with the esophagus, the acid causes irritation and soreness (inflammation) in the esophagus. When reflux happens often or so severely that it causes damage to the esophagus, it is called gastroesophageal reflux disease (GERD). Nutrition therapy can help ease the discomfort of GERD. FOODS OR DRINKS TO AVOID OR LIMIT  Smoking or chewing tobacco. Nicotine is one of the most potent stimulants to acid production in the gastrointestinal tract.  Caffeinated and decaffeinated coffee and black tea.  Regular or low-calorie carbonated beverages or energy drinks (caffeine-free carbonated beverages are allowed).   Strong spices, such as black pepper, white pepper, red pepper, cayenne, curry powder, and chili powder.  Peppermint or spearmint.  Chocolate.  High-fat foods, including meats and fried foods. Extra added fats including oils, butter, salad dressings, and nuts. Limit these to less than 8 tsp per day.  Fruits and vegetables if they are not tolerated, such as citrus fruits or tomatoes.  Alcohol.  Any food that seems to aggravate your condition. If you have questions regarding your diet, call your caregiver or a registered dietitian. OTHER THINGS THAT MAY HELP GERD INCLUDE:   Eating your meals slowly, in a relaxed setting.  Eating 5 to 6 small meals per day instead of 3 large meals.  Eliminating food for a period of time if it causes distress.  Not lying down until 3 hours after eating a meal.  Keeping the head of your bed raised 6 to 9 inches (15 to 23 cm) by using a foam wedge or blocks under the legs of the bed.  Lying flat may make symptoms worse.  Being physically active. Weight loss may be helpful in reducing reflux in overweight or obese adults.  Wear loose fitting clothing EXAMPLE MEAL PLAN This meal plan is approximately 2,000 calories based on https://www.bernard.org/ meal planning guidelines. Breakfast   cup cooked oatmeal.  1 cup strawberries.  1 cup low-fat milk.  1 oz almonds. Snack  1 cup cucumber slices.  6 oz yogurt (made from low-fat or fat-free milk). Lunch  2 slice whole-wheat bread.  2 oz sliced [place].  2 tsp mayonnaise.  1 cup blueberries.  1 cup snap peas. Snack  6 whole-wheat crackers.  1 oz string cheese. Dinner   cup brown rice.  1 cup mixed veggies.  1 tsp olive oil.  3 oz grilled fish. Document Released: 12/23/2004 Document Revised: 03/17/2011 Document Reviewed: 11/08/2010 [facility] Patient Information 2013 [place], [place].

## 2012-03-03 NOTE — Progress Notes (Signed)
Faxed to PCP

## 2012-06-12 ENCOUNTER — Emergency Department (HOSPITAL_COMMUNITY)
Admission: EM | Admit: 2012-06-12 | Discharge: 2012-06-12 | Disposition: A | Discharge status: Home / Self Care | Payer: Medicare Other | Attending: Emergency Medicine | Admitting: Emergency Medicine

## 2012-06-12 ENCOUNTER — Encounter (HOSPITAL_COMMUNITY): Payer: Self-pay | Admitting: *Deleted

## 2012-06-12 DIAGNOSIS — F4320 Adjustment disorder, unspecified: Secondary | ICD-10-CM | POA: Insufficient documentation

## 2012-06-12 DIAGNOSIS — K219 Gastro-esophageal reflux disease without esophagitis: Secondary | ICD-10-CM | POA: Insufficient documentation

## 2012-06-12 DIAGNOSIS — Z79899 Other long term (current) drug therapy: Secondary | ICD-10-CM | POA: Insufficient documentation

## 2012-06-12 DIAGNOSIS — Z8679 Personal history of other diseases of the circulatory system: Secondary | ICD-10-CM | POA: Insufficient documentation

## 2012-06-12 LAB — CBC WITH DIFFERENTIAL/PLATELET
Basophils Relative: 0 % (ref 0–1)
Eosinophils Absolute: 0.1 10*3/uL (ref 0.0–0.7)
Eosinophils Relative: 1 % (ref 0–5)
Lymphs Abs: 1.2 10*3/uL (ref 0.7–4.0)
MCH: 31.9 pg (ref 26.0–34.0)
MCHC: 33.9 g/dL (ref 30.0–36.0)
MCV: 94 fL (ref 78.0–100.0)
Neutrophils Relative %: 69 % (ref 43–77)
Platelets: 174 10*3/uL (ref 150–400)
RBC: 4.86 MIL/uL (ref 4.22–5.81)
RDW: 13.1 % (ref 11.5–15.5)

## 2012-06-12 LAB — RAPID URINE DRUG SCREEN, HOSP PERFORMED
Amphetamines: NOT DETECTED
Opiates: NOT DETECTED
Tetrahydrocannabinol: NOT DETECTED

## 2012-06-12 LAB — BASIC METABOLIC PANEL
BUN: 15 mg/dL (ref 6–23)
CO2: 29 mEq/L (ref 19–32)
Calcium: 10.1 mg/dL (ref 8.4–10.5)
Creatinine, Ser: 1.03 mg/dL (ref 0.50–1.35)
GFR calc non Af Amer: 68 mL/min — ABNORMAL LOW (ref >90)
Glucose, Bld: 100 mg/dL — ABNORMAL HIGH (ref 70–99)
Sodium: 139 mEq/L (ref 135–145)

## 2012-06-12 LAB — ETHANOL: Alcohol, Ethyl (B): <11 mg/dL (ref 0–11)

## 2012-06-12 NOTE — ED Notes (Signed)
Per IVC papers - pt's wife took out a restraining order " a few wks ago", wife currently across street from pt.  Papers states that pt has been walking up the streets at night and taking garbage out "naked."  Pt states he takes trash out without a shirt on.  Papers states wife feels pt is developing dementia.  Pt is alert and oriented x 4 at this time, calm, cooperative.

## 2012-06-12 NOTE — ED Provider Notes (Signed)
History    This chart was scribed for Donnetta Hutching, MD by Sofie Rower, ED Scribe. The patient was seen in room APA17/APA17 and the patient's care was started at 11:35AM.    CSN: [id number]  Arrival date & time 06/12/12  1028   First MD Initiated Contact with Patient 06/12/12 1135      Chief Complaint  Patient presents with  . V70.1    (Consider location/radiation/quality/duration/timing/severity/associated sxs/prior treatment) The history is provided by the patient and the police. No language interpreter was used.    Dequan Kindred Xxxpuckett is a 79 y.o. male , with a hx of GERD, bradycardia, appendectomy, and colonoscopy, who presents to the Emergency Department complaining of V70.1, onset today (06/12/12). The pt reports his wife has taken papers out on him due to his recent walking up and down the road, taking out the trash, and conversing about events which happened in the 1950's. Furthermore, the pt informs he has been walking down the street at 9:00PM and taking the trash out between 8:00AM-12:00 noon. [employer] office reports the pt has recently underwent divorce proceedings with his wife with whom he had been married for the past 60 years. His wife has taken 50B papers out on him which he believes to be unnecessary.  The pt does not smoke or drink alcohol.   PCP is Dr. Juanetta Gosling.    Past Medical History  Diagnosis Date  . GERD (gastroesophageal reflux disease)   . Bradycardia     Past Surgical History  Procedure Laterality Date  . Appendectomy    . Esophagogastroduodenoscopy  2005    Dr. 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 Esophagogastroduodenoscopy  2004    peptic stricture s/p dilation, moderately severe reflux esophagitis  . Colonoscopy  2004    two inflammatory polyps, next TCS 11/2012  . Cataract extraction      bilateral    Family History  Problem Relation Age of Onset  . Heart disease Mother     deceased age 85  .  Diabetes Mother   . Colon cancer Neg Hx     History  Substance Use Topics  . Smoking status: Never Smoker   . Smokeless tobacco: Not on file  . Alcohol Use: No      Review of Systems  All other systems reviewed and are negative.    Allergies  Review of patient's allergies indicates no known allergies.  Home Medications   Current Outpatient Rx  Name  Route  Sig  Dispense  Refill  . doxepin (SINEQUAN) 25 MG capsule   Oral   Take 25 mg by mouth at bedtime.         Marland Kitchen omeprazole (PRILOSEC) 20 MG capsule   Oral   Take 1 capsule (20 mg total) by mouth daily.   60 capsule   11     BP 128/89  Pulse 91  Temp(Src) 97.6 F (36.4 C) (Oral)  Resp 20  SpO2 98%  Physical Exam  Nursing note and vitals reviewed. Constitutional: He is oriented to person, place, and time. He appears well-developed and well-nourished.  HENT:  Head: Normocephalic and atraumatic.  Eyes: Conjunctivae and EOM are normal. Pupils are equal, round, and reactive to light.  Neck: Normal range of motion. Neck supple.  Cardiovascular: Normal rate, regular rhythm and normal heart sounds.   Pulmonary/Chest: Effort normal and breath sounds normal.  Abdominal: Soft. Bowel sounds are normal.  Musculoskeletal: Normal range of motion.  Neurological:  He is alert and oriented to person, place, and time.  Skin: Skin is warm and dry.  Psychiatric: He has a normal mood and affect.    ED Course  Procedures (including critical care time)  DIAGNOSTIC STUDIES: Oxygen Saturation is 98% on room air, normal by my interpretation.    COORDINATION OF CARE:  11:56 AM- Treatment plan discussed with patient. Pt agrees with treatment.      Labs Reviewed  BASIC METABOLIC PANEL - Abnormal; Notable for the following:    Glucose, Bld 100 (*)    GFR calc non Af Amer 68 (*)    GFR calc Af Amer 79 (*)    All other components within normal limits  CBC WITH DIFFERENTIAL  ETHANOL  URINE RAPID DRUG SCREEN (HOSP  PERFORMED)   No results found.   No diagnosis found.    MDM  No suicidal or homicidal ideation.  No evidence of psychosis.   Patient does not desire psychiatric counseling.  We'll discharge home.   Rescind commitment papers     I personally performed the services described in this documentation, which was scribed in my presence. The recorded information has been reviewed and is accurate.   Donnetta Hutching, MD 06/12/12 1311

## 2012-06-12 NOTE — ED Notes (Signed)
Dr. Adriana Simas speaking with pt, [facility] remains at bedside,

## 2012-08-16 ENCOUNTER — Encounter: Payer: Self-pay | Admitting: Internal Medicine

## 2012-09-30 ENCOUNTER — Encounter: Payer: Self-pay | Admitting: Internal Medicine

## 2012-09-30 ENCOUNTER — Ambulatory Visit (INDEPENDENT_AMBULATORY_CARE_PROVIDER_SITE_OTHER): Payer: Medicare Other | Admitting: Internal Medicine

## 2012-09-30 VITALS — BP 117/69 | HR 71 | Temp 97.4°F | Ht 65.0 in | Wt 163.0 lb

## 2012-09-30 DIAGNOSIS — Z1211 Encounter for screening for malignant neoplasm of colon: Secondary | ICD-10-CM

## 2012-09-30 MED ORDER — PEG 3350-KCL-NA BICARB-NACL 420 G PO SOLR: 4000.0000 mL | ORAL | Status: DC

## 2012-09-30 NOTE — Patient Instructions (Signed)
Continue Prilosec daily  Schedule a screening colonoscopy

## 2012-09-30 NOTE — Progress Notes (Signed)
Primary Care Physician:  Fredirick Maudlin, MD Primary Gastroenterologist:  Dr. Jena Gauss  Pre-Procedure History & Physical: HPI:  [name] is a 79 y.o. male is here to discuss setting up a screening colonoscopy. Patient has a history of a screening colonoscopy 10 years ago  - was found to have 2 inflammatory polyps. No family history of colon cancer or polyps. Patient has no lower such as rectal bleeding, etc. In addition, patient has a long history of complicated GERD with reflux esophagitis and peptic stricture requiring dilation 10 years ago. He continues to do well on Prilosec 20 mg daily without recurrent dysphagia. He has rare episodes of reflux. 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   Past Medical History  Diagnosis Date  . GERD (gastroesophageal reflux disease)   . Bradycardia     Past Surgical History  Procedure Laterality Date  . Appendectomy    . Esophagogastroduodenoscopy  2005    Dr. 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 Esophagogastroduodenoscopy  2004    peptic stricture s/p dilation, moderately severe reflux esophagitis  . Colonoscopy  2004    two inflammatory polyps, next TCS 11/2012  . Cataract extraction      bilateral    Prior to Admission medications   Medication Sig Start Date End Date Taking? Authorizing Provider  omeprazole (PRILOSEC) 20 MG capsule Take 1 capsule (20 mg total) by mouth daily. 03/03/12  Yes Nira Retort, NP  doxepin (SINEQUAN) 25 MG capsule Take 25 mg by mouth at bedtime.    Historical Provider, MD    Allergies as of 09/30/2012  . (No Known Allergies)    Family History  Problem Relation Age of Onset  . Heart disease Mother     deceased age 75  . Diabetes Mother   . Colon cancer Neg Hx     History   Social History  . Marital Status: Married    Spouse Name: N/A    Number of Children: N/A  . Years of Education: N/A   Occupational History  .  retired    Social History Main Topics  . Smoking status: Never Smoker   . Smokeless tobacco: Not on file  . Alcohol Use: No  . Drug Use: No  . Sexual Activity: Not on file   Other Topics Concern  . Not on file   Social History Narrative  . No narrative on file    Review of Systems: See HPI, otherwise negative ROS  Physical Exam: BP 117/69  Pulse 71  Temp(Src) 97.4 F (36.3 C) (Oral)  Ht 5\' 5"  (1.651 m)  Wt 163 lb (73.936 kg)  BMI 27.12 kg/m2 General:   Elderly. Alert,  Well-developed, well-nourished, pleasant and cooperative in NAD Head:  Normocephalic and atraumatic. Eyes:  Sclera clear, no icterus.   Conjunctiva pink. Ears:  Normal auditory acuity. Nose:  No deformity, discharge,  or lesions. Mouth:  No deformity or lesions, dentition normal. Neck:  Supple; no masses or thyromegaly. Lungs:  Clear throughout to auscultation.   No wheezes, crackles, or rhonchi. No acute distress. Heart:  Regular rate and rhythm; no murmurs, clicks, rubs,  or gallops. Abdomen:  Nondistended.. Normal bowel sounds, soft and nontender without guarding, and without rebound.  No mass or organomegaly   Msk:  Symmetrical without gross deformities. Normal posture. Pulses:  Normal pulses noted. Extremities:  Without clubbing or edema. Neurologic:  Alert and  oriented x4;  grossly normal neurologically. Skin:  Intact without significant lesions or rashes. Cervical Nodes:  No significant cervical adenopathy. Psych:  Alert and cooperative. Normal mood and affect.   Impression/ Recommendations:   [name] is a pleasant gentleman here to set up a screening colonoscopy. I discussed the pros and cons of a screening colonoscopy at his current age. It's been 10 years since he had a screening colonoscopy. His overall health remains quite good. I feel it is reasonable to offer him one more screening colonoscopy at this time.The risks, benefits, limitations, alternatives and imponderables have been  reviewed with the patient. Questions have been answered. All parties are agreeable.   His GERD symptoms are well controlled on Prilosec 20 mg once daily. I suggested she continue this regimen as the benefits far outweigh the risks.  Risks, benefits, limitations, imponderables and alternatives regarding colonoscopy have been reviewed with the patient. Questions have been answered. All parties agreeable.

## 2012-10-01 ENCOUNTER — Encounter (HOSPITAL_COMMUNITY): Payer: Self-pay | Admitting: Pharmacy Technician

## 2012-10-08 ENCOUNTER — Encounter (HOSPITAL_COMMUNITY): Payer: Self-pay | Admitting: *Deleted

## 2012-10-08 ENCOUNTER — Encounter (HOSPITAL_COMMUNITY)
Admission: RE | Disposition: A | Discharge status: Home / Self Care | Payer: Self-pay | Source: Ambulatory Visit | Attending: Internal Medicine

## 2012-10-08 ENCOUNTER — Ambulatory Visit (HOSPITAL_COMMUNITY)
Admission: RE | Admit: 2012-10-08 | Discharge: 2012-10-08 | Disposition: A | Discharge status: Home / Self Care | Payer: Medicare Other | Source: Ambulatory Visit | Attending: Internal Medicine | Admitting: Internal Medicine

## 2012-10-08 DIAGNOSIS — Z1211 Encounter for screening for malignant neoplasm of colon: Secondary | ICD-10-CM

## 2012-10-08 DIAGNOSIS — K5730 Diverticulosis of large intestine without perforation or abscess without bleeding: Secondary | ICD-10-CM

## 2012-10-08 DIAGNOSIS — K648 Other hemorrhoids: Secondary | ICD-10-CM | POA: Insufficient documentation

## 2012-10-08 DIAGNOSIS — D126 Benign neoplasm of colon, unspecified: Secondary | ICD-10-CM

## 2012-10-08 HISTORY — PX: COLONOSCOPY: SHX5424

## 2012-10-08 HISTORY — PX: COLONOSCOPY: SHX174

## 2012-10-08 SURGERY — COLONOSCOPY
Anesthesia: Moderate Sedation

## 2012-10-08 MED ORDER — ONDANSETRON HCL 4 MG/2ML IJ SOLN
INTRAMUSCULAR | Status: DC | PRN
  Administered 2012-10-08: 4 mg via INTRAVENOUS

## 2012-10-08 MED ORDER — MIDAZOLAM HCL 5 MG/5ML IJ SOLN
INTRAMUSCULAR | Status: AC
  Filled 2012-10-08: qty 10

## 2012-10-08 MED ORDER — SODIUM CHLORIDE 0.9 % IV SOLN
INTRAVENOUS | Status: DC
  Administered 2012-10-08: 09:00:00 via INTRAVENOUS

## 2012-10-08 MED ORDER — STERILE WATER FOR IRRIGATION IR SOLN
Status: DC | PRN
  Administered 2012-10-08: 10:00:00

## 2012-10-08 MED ORDER — MEPERIDINE HCL 100 MG/ML IJ SOLN
INTRAMUSCULAR | Status: AC
  Filled 2012-10-08: qty 2

## 2012-10-08 MED ORDER — MEPERIDINE HCL 100 MG/ML IJ SOLN
INTRAMUSCULAR | Status: DC | PRN
  Administered 2012-10-08: 50 mg via INTRAVENOUS

## 2012-10-08 MED ORDER — MIDAZOLAM HCL 5 MG/5ML IJ SOLN
INTRAMUSCULAR | Status: DC | PRN
  Administered 2012-10-08: 2 mg via INTRAVENOUS
  Administered 2012-10-08: 1 mg via INTRAVENOUS

## 2012-10-08 MED ORDER — ONDANSETRON HCL 4 MG/2ML IJ SOLN
INTRAMUSCULAR | Status: AC
  Filled 2012-10-08: qty 2

## 2012-10-08 NOTE — Op Note (Signed)
[facility] [address] [place], [address]   COLONOSCOPY PROCEDURE REPORT  PATIENT: [name], [name]  MR#:         [id number] BIRTHDATE: [birth year] , 80  yrs. old GENDER: Male ENDOSCOPIST: R.  Roetta Sessions, MD FACP FACG REFERRED BY:  Kari Baars, M.D. PROCEDURE DATE:  10/08/2012 PROCEDURE:     Colonoscopy with polyp ablation, biopsy and snare polypectomy  INDICATIONS: Colorectal cancer screening examination to average risk  INFORMED CONSENT:  The risks, benefits, alternatives and imponderables including but not limited to bleeding, perforation as well as the possibility of a missed lesion have been reviewed.  The potential for biopsy, lesion removal, etc. have also been discussed.  Questions have been answered.  All parties agreeable. Please see the history and physical in the medical record for more information.  MEDICATIONS: Versed 5 mg IV and Demerol 50 mg IV in divided doses. Zofran 4 mg IV.  DESCRIPTION OF PROCEDURE:  After a digital rectal exam was performed, the EC-[id number] ([id number])  colonoscope was advanced from the anus through the rectum and colon to the area of the cecum, ileocecal valve and appendiceal orifice.  The cecum was deeply intubated.  These structures were well-seen and photographed for the record.  From the level of the cecum and ileocecal valve, the scope was slowly and cautiously withdrawn.  The mucosal surfaces were carefully surveyed utilizing scope tip deflection to facilitate fold flattening as needed.  The scope was pulled down into the rectum where a thorough examination including retroflexion was performed.    FINDINGS:  Adequate preparation. Anal canal hemorrhoids; otherwise, normal rectum. Scattered left-sided diverticula.  Patient had multiple diminutive to 5-6 mm size polyps in the mid ascending segment,  at the ileocecal valve and in the cecum.  THERAPEUTIC / DIAGNOSTIC MANEUVERS PERFORMED:  Multiple hot and  cold snare polypectomies as well as cold biopsy and polyp ablation with the hot snare loop performed to remove/treat all of the above-mentioned polyps.  COMPLICATIONS: none  CECAL WITHDRAWAL TIME:  19 minutes  IMPRESSION:  Colonic polyps treated and/or removed as described above. Colonic diverticulosis. Anal canal hemorrhoids.  RECOMMENDATIONS: Followup on pathology. Further recommendations to follow per   _______________________________ eSigned:  R. Roetta Sessions, MD FACP [facility] 10/08/2012 11:00 AM   CC:    PATIENT NAME:  [name], [name] MR#: [id number]

## 2012-10-08 NOTE — Interval H&P Note (Signed)
History and Physical Interval Note:  10/08/2012 10:12 AM  [name]  has presented today for surgery, with the diagnosis of Screening Colonoscopy  The various methods of treatment have been discussed with the patient and family. After consideration of risks, benefits and other options for treatment, the patient has consented to  Procedure(s) with comments: COLONOSCOPY (N/A) - 9:45 as a surgical intervention .  The patient's history has been reviewed, patient examined, no change in status, stable for surgery.  I have reviewed the patient's chart and labs.  Questions were answered to the patient's satisfaction.   No change.  Colonoscopy per plan.The risks, benefits, limitations, alternatives and imponderables have been reviewed with the patient. Questions have been answered. All parties are agreeable.     Eula Listen

## 2012-10-08 NOTE — H&P (View-Only) (Signed)
Primary Care Physician:  Fredirick Maudlin, MD Primary Gastroenterologist:  Dr. Jena Gauss  Pre-Procedure History & Physical: HPI:  [name] is a 79 y.o. male is here to discuss setting up a screening colonoscopy. Patient has a history of a screening colonoscopy 10 years ago  - was found to have 2 inflammatory polyps. No family history of colon cancer or polyps. Patient has no lower such as rectal bleeding, etc. In addition, patient has a long history of complicated GERD with reflux esophagitis and peptic stricture requiring dilation 10 years ago. He continues to do well on Prilosec 20 mg daily without recurrent dysphagia. He has rare episodes of reflux. 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   Past Medical History  Diagnosis Date  . GERD (gastroesophageal reflux disease)   . Bradycardia     Past Surgical History  Procedure Laterality Date  . Appendectomy    . Esophagogastroduodenoscopy  2005    Dr. 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 Esophagogastroduodenoscopy  2004    peptic stricture s/p dilation, moderately severe reflux esophagitis  . Colonoscopy  2004    two inflammatory polyps, next TCS 11/2012  . Cataract extraction      bilateral    Prior to Admission medications   Medication Sig Start Date End Date Taking? Authorizing Provider  omeprazole (PRILOSEC) 20 MG capsule Take 1 capsule (20 mg total) by mouth daily. 03/03/12  Yes Nira Retort, NP  doxepin (SINEQUAN) 25 MG capsule Take 25 mg by mouth at bedtime.    Historical Provider, MD    Allergies as of 09/30/2012  . (No Known Allergies)    Family History  Problem Relation Age of Onset  . Heart disease Mother     deceased age 19  . Diabetes Mother   . Colon cancer Neg Hx     History   Social History  . Marital Status: Married    Spouse Name: N/A    Number of Children: N/A  . Years of Education: N/A   Occupational History  .  retired    Social History Main Topics  . Smoking status: Never Smoker   . Smokeless tobacco: Not on file  . Alcohol Use: No  . Drug Use: No  . Sexual Activity: Not on file   Other Topics Concern  . Not on file   Social History Narrative  . No narrative on file    Review of Systems: See HPI, otherwise negative ROS  Physical Exam: BP 117/69  Pulse 71  Temp(Src) 97.4 F (36.3 C) (Oral)  Ht 5\' 5"  (1.651 m)  Wt 163 lb (73.936 kg)  BMI 27.12 kg/m2 General:   Elderly. Alert,  Well-developed, well-nourished, pleasant and cooperative in NAD Head:  Normocephalic and atraumatic. Eyes:  Sclera clear, no icterus.   Conjunctiva pink. Ears:  Normal auditory acuity. Nose:  No deformity, discharge,  or lesions. Mouth:  No deformity or lesions, dentition normal. Neck:  Supple; no masses or thyromegaly. Lungs:  Clear throughout to auscultation.   No wheezes, crackles, or rhonchi. No acute distress. Heart:  Regular rate and rhythm; no murmurs, clicks, rubs,  or gallops. Abdomen:  Nondistended.. Normal bowel sounds, soft and nontender without guarding, and without rebound.  No mass or organomegaly   Msk:  Symmetrical without gross deformities. Normal posture. Pulses:  Normal pulses noted. Extremities:  Without clubbing or edema. Neurologic:  Alert and  oriented x4;  grossly normal neurologically. Skin:  Intact without significant lesions or rashes. Cervical Nodes:  No significant cervical adenopathy. Psych:  Alert and cooperative. Normal mood and affect.   Impression/ Recommendations:   [name] is a pleasant gentleman here to set up a screening colonoscopy. I discussed the pros and cons of a screening colonoscopy at his current age. It's been 10 years since he had a screening colonoscopy. His overall health remains quite good. I feel it is reasonable to offer him one more screening colonoscopy at this time.The risks, benefits, limitations, alternatives and imponderables have been  reviewed with the patient. Questions have been answered. All parties are agreeable.   His GERD symptoms are well controlled on Prilosec 20 mg once daily. I suggested she continue this regimen as the benefits far outweigh the risks.  Risks, benefits, limitations, imponderables and alternatives regarding colonoscopy have been reviewed with the patient. Questions have been answered. All parties agreeable.

## 2012-10-11 ENCOUNTER — Encounter: Payer: Self-pay | Admitting: Internal Medicine

## 2012-10-12 ENCOUNTER — Encounter (HOSPITAL_COMMUNITY): Payer: Self-pay | Admitting: Internal Medicine

## 2013-02-08 ENCOUNTER — Other Ambulatory Visit: Payer: Self-pay | Admitting: Gastroenterology

## 2013-08-05 ENCOUNTER — Other Ambulatory Visit: Payer: Self-pay | Admitting: Gastroenterology

## 2013-10-10 ENCOUNTER — Emergency Department (HOSPITAL_COMMUNITY)
Admission: EM | Admit: 2013-10-10 | Discharge: 2013-10-10 | Disposition: A | Discharge status: Home / Self Care | Payer: Medicare Other | Attending: Emergency Medicine | Admitting: Emergency Medicine

## 2013-10-10 ENCOUNTER — Emergency Department (HOSPITAL_COMMUNITY): Payer: Medicare Other

## 2013-10-10 ENCOUNTER — Encounter (HOSPITAL_COMMUNITY): Payer: Self-pay | Admitting: Emergency Medicine

## 2013-10-10 DIAGNOSIS — Z79899 Other long term (current) drug therapy: Secondary | ICD-10-CM | POA: Insufficient documentation

## 2013-10-10 DIAGNOSIS — R202 Paresthesia of skin: Secondary | ICD-10-CM | POA: Diagnosis present

## 2013-10-10 DIAGNOSIS — K219 Gastro-esophageal reflux disease without esophagitis: Secondary | ICD-10-CM | POA: Diagnosis not present

## 2013-10-10 DIAGNOSIS — R200 Anesthesia of skin: Secondary | ICD-10-CM

## 2013-10-10 NOTE — ED Provider Notes (Signed)
CSN: [id number]     Arrival date & time 10/10/13  1043 History  This chart was scribed for Nat Christen, MD by Rayfield Citizen, ED Scribe. This patient was seen in room [facility]17[facility]17 and the patient's care was started at 11:16 AM.    Chief Complaint  Patient presents with  . rt hand numbness    The history is provided by the patient. No language interpreter was used.    HPI Comments: [name] is a 80 y.o. male who presents to the Emergency Department complaining of 1 week of intermittent numbness of his right hand; patient describes his symptoms as tingling, "like it's going to sleep," and that is particularly noticeable at night when it wakes him from sleep. He explains that it radiates on his occasion to his right elbow. He states that his hand is "asleep" or numb at this moment. Patient reports that his symptoms are better during the day and worse at night.  He denies any injury. He denies any neck pain. No obvious repetitive motion injury. No frank neurological deficits, stiff neck, fever, chills. Symptoms are intermittent.  His PCP is Dr. Luan Pulling.   Past Medical History  Diagnosis Date  . GERD (gastroesophageal reflux disease)   . Bradycardia   . Tubular adenoma   . Hemorrhoids    Past Surgical History  Procedure Laterality Date  . Appendectomy    . Esophagogastroduodenoscopy  2005    Dr. 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 Esophagogastroduodenoscopy  2004    peptic stricture s/p dilation, moderately severe reflux esophagitis  . Colonoscopy  2004    two inflammatory polyps, next TCS 11/2012  . Cataract extraction      bilateral  . Colonoscopy  10/08/12    Dr. Ok Edwards polyps treated and/or removed as describedabove. Colonic diverticulosis. Anal canal hemorrhoids. tubular adenoma  . Colonoscopy N/A 10/08/2012    Procedure: COLONOSCOPY;  Surgeon: Daneil Dolin, MD;  Location: [facility];  Service: Endoscopy;  Laterality: N/A;  9:45    Family History  Problem Relation Age of Onset  . Heart disease Mother     deceased age 52  . Diabetes Mother   . Colon cancer Neg Hx    History  Substance Use Topics  . Smoking status: Never Smoker   . Smokeless tobacco: Not on file  . Alcohol Use: No    Review of Systems  A complete 10 system review of systems was obtained and all systems are negative except as noted in the HPI and PMH.    Allergies  Review of patient's allergies indicates no known allergies.  Home Medications   Prior to Admission medications   Medication Sig Start Date End Date Taking? Authorizing Provider  doxepin (SINEQUAN) 25 MG capsule Take 25 mg by mouth at bedtime.   Yes Historical Provider, MD  omeprazole (PRILOSEC) 20 MG capsule TAKE 1 CAPSULE BY MOUTH ONCE A DAY.   Yes Orvil Feil, NP   BP 116/97  Pulse 72  Temp(Src) 97.8 F (36.6 C) (Oral)  Resp 18  Ht 6' (1.829 m)  Wt 162 lb (73.483 kg)  BMI 21.97 kg/m2  SpO2 98% Physical Exam  Nursing note and vitals reviewed. Constitutional: He is oriented to person, place, and time. He appears well-developed and well-nourished.  HENT:  Head: Normocephalic and atraumatic.  Eyes: Conjunctivae and EOM are normal. Pupils are equal, round, and reactive to light.  Neck: Normal range of motion. Neck supple.  Cardiovascular:  Normal rate, regular rhythm and normal heart sounds.   Pulmonary/Chest: Effort normal and breath sounds normal.  Abdominal: Soft. Bowel sounds are normal.  Musculoskeletal: Normal range of motion.  Intermittent numbness and tingling in the right hand   Neurological: He is alert and oriented to person, place, and time.  Skin: Skin is warm and dry.  Psychiatric: He has a normal mood and affect. His behavior is normal.    ED Course  Procedures   DIAGNOSTIC STUDIES: Oxygen Saturation is 98% on RA, normal by my interpretation.    COORDINATION OF CARE: 11:17 AM Discussed treatment plan with pt at bedside and pt agreed to plan;  patient will receive CT.   Labs Review Labs Reviewed - No data to display  Imaging Review Ct Head Wo Contrast  10/10/2013   CLINICAL DATA:  Right arm numbness x1 week.  EXAM: CT HEAD WITHOUT CONTRAST  TECHNIQUE: Contiguous axial images were obtained from the base of the skull through the vertex without intravenous contrast.  COMPARISON:  None.  FINDINGS: No intra-axial or extra-axial pathologic fluid or blood collections. 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 White matter changes consistent with chronic ischemia. No hydrocephalus. No acute bony abnormality. Visualized paranasal sinuses and mastoids are clear.  IMPRESSION: Chronic ischemic change.  No acute abnormality.   Electronically Signed   By: Marcello Moores  Register   On: 10/10/2013 12:08     EKG Interpretation None      MDM   Final diagnoses:  Numbness and tingling in right hand   CT head negative. Suspect nerve impingement from repetitive motion. Patient will get primary care followup.  I personally performed the services described in this documentation, which was scribed in my presence. The recorded information has been reviewed and is accurate.     Nat Christen, MD 10/10/13 1226

## 2013-10-10 NOTE — ED Notes (Signed)
Numbness of rt hand for 1 week, good radial pulse  Hand is warm.   No injury.  Good movement of hand

## 2013-10-10 NOTE — Discharge Instructions (Signed)
Brain scan was normal. Followup with your primary care Dr.

## 2014-02-07 DIAGNOSIS — H02135 Senile ectropion of left lower eyelid: Secondary | ICD-10-CM | POA: Diagnosis not present

## 2014-02-07 DIAGNOSIS — H02132 Senile ectropion of right lower eyelid: Secondary | ICD-10-CM | POA: Diagnosis not present

## 2014-02-07 DIAGNOSIS — Z961 Presence of intraocular lens: Secondary | ICD-10-CM | POA: Diagnosis not present

## 2014-02-07 DIAGNOSIS — H3531 Nonexudative age-related macular degeneration: Secondary | ICD-10-CM | POA: Diagnosis not present

## 2014-03-10 ENCOUNTER — Other Ambulatory Visit: Payer: Self-pay | Admitting: Gastroenterology

## 2014-03-13 ENCOUNTER — Telehealth: Payer: Self-pay | Admitting: Nurse Practitioner

## 2014-03-13 NOTE — Telephone Encounter (Signed)
PUT PT ON 3 MONTH RECALL WANTS RMR ONLY

## 2014-03-13 NOTE — Telephone Encounter (Signed)
Patient refill sent in for 6 months but at that time he'll be due for a return visit (2 years since last visit). Please notify patient.

## 2014-04-11 DIAGNOSIS — Z0000 Encounter for general adult medical examination without abnormal findings: Secondary | ICD-10-CM | POA: Diagnosis not present

## 2014-04-11 DIAGNOSIS — Z1211 Encounter for screening for malignant neoplasm of colon: Secondary | ICD-10-CM | POA: Diagnosis not present

## 2014-05-22 ENCOUNTER — Encounter: Payer: Self-pay | Admitting: Internal Medicine

## 2014-10-21 ENCOUNTER — Inpatient Hospital Stay (HOSPITAL_COMMUNITY)
Admission: EM | Admit: 2014-10-21 | Discharge: 2014-10-24 | DRG: 184 | Disposition: A | Discharge status: Home / Self Care | Payer: Medicare Other | Attending: General Surgery | Admitting: General Surgery

## 2014-10-21 ENCOUNTER — Encounter (HOSPITAL_COMMUNITY): Payer: Self-pay

## 2014-10-21 ENCOUNTER — Other Ambulatory Visit: Payer: Self-pay

## 2014-10-21 ENCOUNTER — Emergency Department (HOSPITAL_COMMUNITY): Payer: Medicare Other

## 2014-10-21 DIAGNOSIS — S2242XA Multiple fractures of ribs, left side, initial encounter for closed fracture: Secondary | ICD-10-CM | POA: Diagnosis not present

## 2014-10-21 DIAGNOSIS — I442 Atrioventricular block, complete: Secondary | ICD-10-CM | POA: Diagnosis not present

## 2014-10-21 DIAGNOSIS — J9690 Respiratory failure, unspecified, unspecified whether with hypoxia or hypercapnia: Secondary | ICD-10-CM

## 2014-10-21 DIAGNOSIS — H9190 Unspecified hearing loss, unspecified ear: Secondary | ICD-10-CM | POA: Diagnosis present

## 2014-10-21 DIAGNOSIS — S199XXA Unspecified injury of neck, initial encounter: Secondary | ICD-10-CM | POA: Diagnosis not present

## 2014-10-21 DIAGNOSIS — I4891 Unspecified atrial fibrillation: Secondary | ICD-10-CM | POA: Diagnosis present

## 2014-10-21 DIAGNOSIS — S0990XA Unspecified injury of head, initial encounter: Secondary | ICD-10-CM | POA: Diagnosis not present

## 2014-10-21 DIAGNOSIS — S2239XA Fracture of one rib, unspecified side, initial encounter for closed fracture: Secondary | ICD-10-CM

## 2014-10-21 DIAGNOSIS — S2232XA Fracture of one rib, left side, initial encounter for closed fracture: Secondary | ICD-10-CM

## 2014-10-21 DIAGNOSIS — S2249XA Multiple fractures of ribs, unspecified side, initial encounter for closed fracture: Secondary | ICD-10-CM | POA: Diagnosis present

## 2014-10-21 DIAGNOSIS — R001 Bradycardia, unspecified: Secondary | ICD-10-CM | POA: Diagnosis not present

## 2014-10-21 LAB — CBC
HCT: 42.7 % (ref 39.0–52.0)
Hemoglobin: 13.7 g/dL (ref 13.0–17.0)
MCH: 30.7 pg (ref 26.0–34.0)
MCHC: 32.1 g/dL (ref 30.0–36.0)
MCV: 95.7 fL (ref 78.0–100.0)
PLATELETS: 154 10*3/uL (ref 150–400)
RBC: 4.46 MIL/uL (ref 4.22–5.81)
RDW: 13.3 % (ref 11.5–15.5)
WBC: 8.3 10*3/uL (ref 4.0–10.5)

## 2014-10-21 LAB — I-STAT CHEM 8, ED
BUN: 11 mg/dL (ref 6–20)
Calcium, Ion: 1.17 mmol/L (ref 1.13–1.30)
Chloride: 105 mmol/L (ref 101–111)
Creatinine, Ser: 0.8 mg/dL (ref 0.61–1.24)
GLUCOSE: 132 mg/dL — AB (ref 65–99)
HCT: 45 % (ref 39.0–52.0)
HEMOGLOBIN: 15.3 g/dL (ref 13.0–17.0)
Potassium: 3.8 mmol/L (ref 3.5–5.1)
Sodium: 144 mmol/L (ref 135–145)
TCO2: 23 mmol/L (ref 0–100)

## 2014-10-21 LAB — I-STAT CG4 LACTIC ACID, ED: Lactic Acid, Venous: 2.32 mmol/L (ref 0.5–2.0)

## 2014-10-21 LAB — PROTIME-INR
INR: 1 (ref 0.00–1.49)
PROTHROMBIN TIME: 13.4 s (ref 11.6–15.2)

## 2014-10-21 LAB — ETHANOL

## 2014-10-21 MED ORDER — IOHEXOL 300 MG/ML  SOLN
100.0000 mL | Freq: Once | INTRAMUSCULAR | Status: AC | PRN
  Administered 2014-10-21: 100 mL via INTRAVENOUS

## 2014-10-21 MED ORDER — FENTANYL CITRATE (PF) 100 MCG/2ML IJ SOLN
INTRAMUSCULAR | Status: AC
Start: 2014-10-21 — End: 2014-10-21
  Filled 2014-10-21: qty 2

## 2014-10-21 MED ORDER — FENTANYL CITRATE (PF) 100 MCG/2ML IJ SOLN
INTRAMUSCULAR | Status: AC | PRN
  Administered 2014-10-21 (×2): 50 ug via INTRAVENOUS

## 2014-10-21 MED ORDER — ATROPINE SULFATE 0.1 MG/ML IJ SOLN
INTRAMUSCULAR | Status: DC
Start: 2014-10-21 — End: 2014-10-22
  Filled 2014-10-21: qty 20

## 2014-10-21 NOTE — ED Notes (Signed)
Pts HR decreased to 32 and pt became somnolent. Episode lasted about 2 minutes and HR increased back up to 70s. Two episodes thus far. Dr. Lita Mains aware and at bedside. Zoll pads placed on patient.

## 2014-10-21 NOTE — ED Notes (Signed)
Pt transferred to CT on Zoll and monitor with 2L bolus of NS running. Patient HR 30-58 bpm. BP maintained with systolic over 488'Q. Patient agitated repeatedly requesting staff to not touch chest. Patient mentating. Dr. Grandville Silos and Dr. Lita Mains at [place] with patient.

## 2014-10-21 NOTE — ED Notes (Signed)
Pt being transferred to [place] [facility] by Gwen Her, RN and Dr. Lita Mains.
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
## 2014-10-22 ENCOUNTER — Inpatient Hospital Stay (HOSPITAL_COMMUNITY): Payer: Medicare Other

## 2014-10-22 DIAGNOSIS — R001 Bradycardia, unspecified: Secondary | ICD-10-CM

## 2014-10-22 DIAGNOSIS — I442 Atrioventricular block, complete: Secondary | ICD-10-CM | POA: Diagnosis not present

## 2014-10-22 DIAGNOSIS — H9190 Unspecified hearing loss, unspecified ear: Secondary | ICD-10-CM | POA: Diagnosis not present

## 2014-10-22 DIAGNOSIS — S2249XA Multiple fractures of ribs, unspecified side, initial encounter for closed fracture: Secondary | ICD-10-CM | POA: Diagnosis present

## 2014-10-22 DIAGNOSIS — I4891 Unspecified atrial fibrillation: Secondary | ICD-10-CM | POA: Diagnosis not present

## 2014-10-22 DIAGNOSIS — S2242XA Multiple fractures of ribs, left side, initial encounter for closed fracture: Secondary | ICD-10-CM | POA: Diagnosis not present

## 2014-10-22 LAB — BASIC METABOLIC PANEL
Anion gap: 7 (ref 5–15)
BUN: 7 mg/dL (ref 6–20)
CHLORIDE: 107 mmol/L (ref 101–111)
CO2: 24 mmol/L (ref 22–32)
CREATININE: 0.74 mg/dL (ref 0.61–1.24)
Calcium: 8.5 mg/dL — ABNORMAL LOW (ref 8.9–10.3)
GFR calc Af Amer: >60 mL/min (ref >60)
GFR calc non Af Amer: >60 mL/min (ref >60)
Glucose, Bld: 152 mg/dL — ABNORMAL HIGH (ref 65–99)
POTASSIUM: 3.8 mmol/L (ref 3.5–5.1)
Sodium: 138 mmol/L (ref 135–145)

## 2014-10-22 LAB — URINALYSIS, ROUTINE W REFLEX MICROSCOPIC
Bilirubin Urine: NEGATIVE
GLUCOSE, UA: NEGATIVE mg/dL
HGB URINE DIPSTICK: NEGATIVE
Ketones, ur: 15 mg/dL — AB
LEUKOCYTES UA: NEGATIVE
Nitrite: NEGATIVE
PH: 6 (ref 5.0–8.0)
PROTEIN: NEGATIVE mg/dL
SPECIFIC GRAVITY, URINE: 1.015 (ref 1.005–1.030)
Urobilinogen, UA: 1 mg/dL (ref 0.0–1.0)

## 2014-10-22 LAB — PREPARE FRESH FROZEN PLASMA
Unit division: 0
Unit division: 0

## 2014-10-22 LAB — CBC
HEMATOCRIT: 40.2 % (ref 39.0–52.0)
HEMOGLOBIN: 13 g/dL (ref 13.0–17.0)
MCH: 30.7 pg (ref 26.0–34.0)
MCHC: 32.3 g/dL (ref 30.0–36.0)
MCV: 94.8 fL (ref 78.0–100.0)
Platelets: 137 10*3/uL — ABNORMAL LOW (ref 150–400)
RBC: 4.24 MIL/uL (ref 4.22–5.81)
RDW: 13.2 % (ref 11.5–15.5)
WBC: 8.2 10*3/uL (ref 4.0–10.5)

## 2014-10-22 LAB — COMPREHENSIVE METABOLIC PANEL
ALBUMIN: 3.6 g/dL (ref 3.5–5.0)
ALK PHOS: 70 U/L (ref 38–126)
ALT: 21 U/L (ref 17–63)
AST: 32 U/L (ref 15–41)
Anion gap: 9 (ref 5–15)
BUN: 9 mg/dL (ref 6–20)
CALCIUM: 9 mg/dL (ref 8.9–10.3)
CHLORIDE: 106 mmol/L (ref 101–111)
CO2: 24 mmol/L (ref 22–32)
CREATININE: 0.89 mg/dL (ref 0.61–1.24)
GFR calc Af Amer: >60 mL/min (ref >60)
GFR calc non Af Amer: >60 mL/min (ref >60)
GLUCOSE: 132 mg/dL — AB (ref 65–99)
Potassium: 3.9 mmol/L (ref 3.5–5.1)
SODIUM: 139 mmol/L (ref 135–145)
Total Bilirubin: 0.7 mg/dL (ref 0.3–1.2)
Total Protein: 6.3 g/dL — ABNORMAL LOW (ref 6.5–8.1)

## 2014-10-22 LAB — TYPE AND SCREEN
ABO/RH(D): O POS
Antibody Screen: NEGATIVE
UNIT DIVISION: 0
Unit division: 0

## 2014-10-22 LAB — ABO/RH: ABO/RH(D): O POS

## 2014-10-22 LAB — BLOOD PRODUCT ORDER (VERBAL) VERIFICATION

## 2014-10-22 LAB — MRSA PCR SCREENING: MRSA by PCR: NEGATIVE

## 2014-10-22 LAB — TROPONIN I: Troponin I: <0.03 ng/mL (ref <0.031)

## 2014-10-22 LAB — CDS SEROLOGY

## 2014-10-22 MED ORDER — ENOXAPARIN SODIUM 40 MG/0.4ML ~~LOC~~ SOLN
40.0000 mg | SUBCUTANEOUS | Status: DC
  Administered 2014-10-22 – 2014-10-24 (×3): 40 mg via SUBCUTANEOUS
  Filled 2014-10-22 (×3): qty 0.4

## 2014-10-22 MED ORDER — PANTOPRAZOLE SODIUM 40 MG PO TBEC
40.0000 mg | DELAYED_RELEASE_TABLET | Freq: Every day | ORAL | Status: DC
Start: 2014-10-22 — End: 2014-10-23
  Administered 2014-10-22 – 2014-10-23 (×2): 40 mg via ORAL
  Filled 2014-10-22 (×2): qty 1

## 2014-10-22 MED ORDER — SODIUM CHLORIDE 0.9 % IJ SOLN: 9.0000 mL | INTRAMUSCULAR | Status: DC | PRN

## 2014-10-22 MED ORDER — IPRATROPIUM-ALBUTEROL 0.5-2.5 (3) MG/3ML IN SOLN
3.0000 mL | RESPIRATORY_TRACT | Status: DC
  Administered 2014-10-22 (×2): 3 mL via RESPIRATORY_TRACT
  Filled 2014-10-22 (×3): qty 3

## 2014-10-22 MED ORDER — NALOXONE HCL 0.4 MG/ML IJ SOLN: 0.4000 mg | INTRAMUSCULAR | Status: DC | PRN

## 2014-10-22 MED ORDER — DIPHENHYDRAMINE HCL 50 MG/ML IJ SOLN: 12.5000 mg | Freq: Four times a day (QID) | INTRAMUSCULAR | Status: DC | PRN

## 2014-10-22 MED ORDER — MORPHINE SULFATE (PF) 2 MG/ML IV SOLN
2.0000 mg | INTRAVENOUS | Status: DC | PRN
  Administered 2014-10-22: 4 mg via INTRAVENOUS
  Administered 2014-10-23: 2 mg via INTRAVENOUS
  Filled 2014-10-22: qty 2
  Filled 2014-10-22: qty 1

## 2014-10-22 MED ORDER — FENTANYL 10 MCG/ML IV SOLN
INTRAVENOUS | Status: DC
  Filled 2014-10-22: qty 50

## 2014-10-22 MED ORDER — ONDANSETRON HCL 4 MG/2ML IJ SOLN
4.0000 mg | Freq: Four times a day (QID) | INTRAMUSCULAR | Status: DC | PRN
  Administered 2014-10-22: 4 mg via INTRAVENOUS
  Filled 2014-10-22: qty 2

## 2014-10-22 MED ORDER — DIPHENHYDRAMINE HCL 12.5 MG/5ML PO ELIX: 12.5000 mg | ORAL_SOLUTION | Freq: Four times a day (QID) | ORAL | Status: DC | PRN

## 2014-10-22 MED ORDER — CETYLPYRIDINIUM CHLORIDE 0.05 % MT LIQD
7.0000 mL | Freq: Two times a day (BID) | OROMUCOSAL | Status: DC
  Administered 2014-10-22 – 2014-10-24 (×4): 7 mL via OROMUCOSAL

## 2014-10-22 MED ORDER — OXYCODONE HCL 5 MG PO TABS
10.0000 mg | ORAL_TABLET | ORAL | Status: DC | PRN
  Administered 2014-10-22 – 2014-10-23 (×5): 10 mg via ORAL
  Filled 2014-10-22 (×5): qty 2

## 2014-10-22 MED ORDER — IPRATROPIUM-ALBUTEROL 0.5-2.5 (3) MG/3ML IN SOLN: 3.0000 mL | RESPIRATORY_TRACT | Status: DC | PRN

## 2014-10-22 MED ORDER — KCL IN DEXTROSE-NACL 20-5-0.45 MEQ/L-%-% IV SOLN
INTRAVENOUS | Status: DC
  Administered 2014-10-22 – 2014-10-23 (×2): via INTRAVENOUS
  Filled 2014-10-22 (×4): qty 1000

## 2014-10-22 MED ORDER — ACETAMINOPHEN 325 MG PO TABS
650.0000 mg | ORAL_TABLET | ORAL | Status: DC | PRN
  Administered 2014-10-23: 650 mg via ORAL
  Filled 2014-10-22: qty 2

## 2014-10-22 MED ORDER — PANTOPRAZOLE SODIUM 40 MG IV SOLR
40.0000 mg | Freq: Every day | INTRAVENOUS | Status: DC
  Filled 2014-10-22: qty 40

## 2014-10-22 MED ORDER — ONDANSETRON HCL 4 MG PO TABS: 4.0000 mg | ORAL_TABLET | Freq: Four times a day (QID) | ORAL | Status: DC | PRN

## 2014-10-22 MED ORDER — FENTANYL CITRATE (PF) 100 MCG/2ML IJ SOLN
INTRAMUSCULAR | Status: AC
  Filled 2014-10-22: qty 2
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
## 2014-10-22 NOTE — Progress Notes (Signed)
   10/22/14 0000  Clinical Encounter Type  Visited With Patient  Visit Type Trauma  Referral From Nurse  Chaplain visited with healthcare provider; Chaplain noticed Pt was taken for testing; no family present; Chaplain can return when family member comes
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
## 2014-10-22 NOTE — ED Notes (Signed)
Pt showed this RN a laceration that he has on the palm of his right hand. Pt stated "I've been trying to pick it off, but I just can't get it".

## 2014-10-22 NOTE — Progress Notes (Signed)
Pt refuses to be examined  "I don't need a heart doctor,  I can handle this on my own"  Review of tele here in ICU, I have not seen any CHB or pauses.  He does have conduction dz   I do not have strips from ER to review  OK to tx to tele.  Will follow, even distantly  Pt up with assist only  Would be good to get him to ambulate to follow HR response.  Will follow

## 2014-10-22 NOTE — Progress Notes (Signed)
Utilization Review Completed.[name] T10/16/2016  
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
## 2014-10-23 ENCOUNTER — Inpatient Hospital Stay (HOSPITAL_COMMUNITY): Payer: Medicare Other

## 2014-10-23 DIAGNOSIS — R001 Bradycardia, unspecified: Secondary | ICD-10-CM | POA: Diagnosis present

## 2014-10-23 LAB — BASIC METABOLIC PANEL
ANION GAP: 6 (ref 5–15)
BUN: 6 mg/dL (ref 6–20)
CHLORIDE: 101 mmol/L (ref 101–111)
CO2: 27 mmol/L (ref 22–32)
Calcium: 8.4 mg/dL — ABNORMAL LOW (ref 8.9–10.3)
Creatinine, Ser: 0.75 mg/dL (ref 0.61–1.24)
Glucose, Bld: 112 mg/dL — ABNORMAL HIGH (ref 65–99)
POTASSIUM: 4.1 mmol/L (ref 3.5–5.1)
SODIUM: 134 mmol/L — AB (ref 135–145)

## 2014-10-23 LAB — CBC
HCT: 39.6 % (ref 39.0–52.0)
HEMOGLOBIN: 12.8 g/dL — AB (ref 13.0–17.0)
MCH: 30.9 pg (ref 26.0–34.0)
MCHC: 32.3 g/dL (ref 30.0–36.0)
MCV: 95.7 fL (ref 78.0–100.0)
PLATELETS: 130 10*3/uL — AB (ref 150–400)
RBC: 4.14 MIL/uL — AB (ref 4.22–5.81)
RDW: 13.2 % (ref 11.5–15.5)
WBC: 7.1 10*3/uL (ref 4.0–10.5)

## 2014-10-23 MED ORDER — NAPROXEN 250 MG PO TABS
500.0000 mg | ORAL_TABLET | Freq: Two times a day (BID) | ORAL | Status: DC
  Administered 2014-10-23 – 2014-10-24 (×3): 500 mg via ORAL
  Filled 2014-10-23: qty 2
  Filled 2014-10-23 (×2): qty 1
  Filled 2014-10-23: qty 2
  Filled 2014-10-23: qty 1

## 2014-10-23 MED ORDER — POLYETHYLENE GLYCOL 3350 17 G PO PACK
17.0000 g | PACK | Freq: Every day | ORAL | Status: DC
  Administered 2014-10-23: 17 g via ORAL
  Filled 2014-10-23 (×2): qty 1

## 2014-10-23 MED ORDER — DOCUSATE SODIUM 100 MG PO CAPS
100.0000 mg | ORAL_CAPSULE | Freq: Two times a day (BID) | ORAL | Status: DC
  Administered 2014-10-23 (×2): 100 mg via ORAL
  Filled 2014-10-23 (×2): qty 1

## 2014-10-23 MED ORDER — OXYCODONE HCL 5 MG PO TABS
5.0000 mg | ORAL_TABLET | ORAL | Status: DC | PRN
  Administered 2014-10-23: 10 mg via ORAL
  Administered 2014-10-23: 5 mg via ORAL
  Filled 2014-10-23: qty 2
  Filled 2014-10-23: qty 1

## 2014-10-23 MED ORDER — MORPHINE SULFATE (PF) 2 MG/ML IV SOLN: 2.0000 mg | INTRAVENOUS | Status: DC | PRN
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
## 2014-10-23 NOTE — Progress Notes (Signed)
Pt to [facility] to [facility], called report, fiance present & aware of [place].

## 2014-10-23 NOTE — Progress Notes (Signed)
Report received from [place], [place] for transfer to [phone]

## 2014-10-24 MED ORDER — OXYCODONE HCL 5 MG PO TABS: 5.0000 mg | ORAL_TABLET | ORAL | Status: AC | PRN

## 2014-10-24 MED ORDER — DOCUSATE SODIUM 100 MG PO CAPS
200.0000 mg | ORAL_CAPSULE | Freq: Two times a day (BID) | ORAL | Status: DC
  Filled 2014-10-24: qty 2

## 2014-10-24 MED ORDER — MAGNESIUM CITRATE PO SOLN
1.0000 | Freq: Once | ORAL | Status: DC
  Filled 2014-10-24: qty 296
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
## 2014-10-24 NOTE — Progress Notes (Signed)
OT NOTE  Recommending 3n1 for home. Pt aware that DME will be delivered to the room prior to d/c.   Jeri Modena   OTR/L Pager: [phone] Office: [phone] .
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
## 2014-10-24 NOTE — Discharge Instructions (Signed)
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
## 2014-10-25 DIAGNOSIS — R001 Bradycardia, unspecified: Secondary | ICD-10-CM | POA: Diagnosis not present

## 2014-10-25 DIAGNOSIS — S2242XD Multiple fractures of ribs, left side, subsequent encounter for fracture with routine healing: Secondary | ICD-10-CM | POA: Diagnosis not present

## 2014-10-26 ENCOUNTER — Encounter (HOSPITAL_COMMUNITY): Payer: Self-pay | Admitting: Emergency Medicine

## 2014-11-02 DIAGNOSIS — S2242XD Multiple fractures of ribs, left side, subsequent encounter for fracture with routine healing: Secondary | ICD-10-CM | POA: Diagnosis not present

## 2014-11-02 DIAGNOSIS — R001 Bradycardia, unspecified: Secondary | ICD-10-CM | POA: Diagnosis not present

## 2014-11-29 DIAGNOSIS — Z23 Encounter for immunization: Secondary | ICD-10-CM | POA: Diagnosis not present

## 2014-12-21 ENCOUNTER — Other Ambulatory Visit (HOSPITAL_COMMUNITY): Payer: Self-pay | Admitting: Pulmonary Disease

## 2014-12-21 ENCOUNTER — Ambulatory Visit (HOSPITAL_COMMUNITY)
Admission: RE | Admit: 2014-12-21 | Discharge: 2014-12-21 | Disposition: A | Discharge status: Home / Self Care | Payer: Medicare Other | Source: Ambulatory Visit | Attending: Pulmonary Disease | Admitting: Pulmonary Disease

## 2014-12-21 DIAGNOSIS — S2239XA Fracture of one rib, unspecified side, initial encounter for closed fracture: Secondary | ICD-10-CM

## 2014-12-21 DIAGNOSIS — S2232XD Fracture of one rib, left side, subsequent encounter for fracture with routine healing: Secondary | ICD-10-CM | POA: Diagnosis not present

## 2014-12-21 DIAGNOSIS — M438X4 Other specified deforming dorsopathies, thoracic region: Secondary | ICD-10-CM | POA: Insufficient documentation

## 2015-02-23 DIAGNOSIS — K219 Gastro-esophageal reflux disease without esophagitis: Secondary | ICD-10-CM | POA: Diagnosis not present

## 2015-02-23 DIAGNOSIS — G5601 Carpal tunnel syndrome, right upper limb: Secondary | ICD-10-CM | POA: Diagnosis not present

## 2015-02-23 DIAGNOSIS — G4700 Insomnia, unspecified: Secondary | ICD-10-CM | POA: Diagnosis not present

## 2015-02-23 DIAGNOSIS — S2242XD Multiple fractures of ribs, left side, subsequent encounter for fracture with routine healing: Secondary | ICD-10-CM | POA: Diagnosis not present

## 2015-04-09 ENCOUNTER — Ambulatory Visit (HOSPITAL_COMMUNITY)
Admission: RE | Admit: 2015-04-09 | Discharge: 2015-04-09 | Disposition: A | Discharge status: Home / Self Care | Payer: Medicare Other | Source: Ambulatory Visit | Attending: Pulmonary Disease | Admitting: Pulmonary Disease

## 2015-04-09 ENCOUNTER — Other Ambulatory Visit (HOSPITAL_COMMUNITY): Payer: Self-pay | Admitting: Pulmonary Disease

## 2015-04-09 DIAGNOSIS — S2232XA Fracture of one rib, left side, initial encounter for closed fracture: Secondary | ICD-10-CM

## 2015-04-09 DIAGNOSIS — S2232XD Fracture of one rib, left side, subsequent encounter for fracture with routine healing: Secondary | ICD-10-CM | POA: Insufficient documentation

## 2015-04-09 DIAGNOSIS — X58XXXD Exposure to other specified factors, subsequent encounter: Secondary | ICD-10-CM | POA: Insufficient documentation

## 2015-04-12 DIAGNOSIS — Z0000 Encounter for general adult medical examination without abnormal findings: Secondary | ICD-10-CM | POA: Diagnosis not present

## 2015-05-08 DIAGNOSIS — H02135 Senile ectropion of left lower eyelid: Secondary | ICD-10-CM | POA: Diagnosis not present

## 2015-05-08 DIAGNOSIS — H02132 Senile ectropion of right lower eyelid: Secondary | ICD-10-CM | POA: Diagnosis not present

## 2015-05-08 DIAGNOSIS — H353132 Nonexudative age-related macular degeneration, bilateral, intermediate dry stage: Secondary | ICD-10-CM | POA: Diagnosis not present

## 2015-05-08 DIAGNOSIS — Z961 Presence of intraocular lens: Secondary | ICD-10-CM | POA: Diagnosis not present

## 2015-07-18 DIAGNOSIS — R319 Hematuria, unspecified: Secondary | ICD-10-CM | POA: Diagnosis not present

## 2015-07-18 DIAGNOSIS — K210 Gastro-esophageal reflux disease with esophagitis: Secondary | ICD-10-CM | POA: Diagnosis not present

## 2015-07-19 DIAGNOSIS — R634 Abnormal weight loss: Secondary | ICD-10-CM | POA: Diagnosis not present

## 2015-07-19 DIAGNOSIS — K210 Gastro-esophageal reflux disease with esophagitis: Secondary | ICD-10-CM | POA: Diagnosis not present

## 2015-07-19 DIAGNOSIS — R319 Hematuria, unspecified: Secondary | ICD-10-CM | POA: Diagnosis not present

## 2016-04-07 DIAGNOSIS — G44219 Episodic tension-type headache, not intractable: Secondary | ICD-10-CM | POA: Diagnosis not present

## 2016-04-07 DIAGNOSIS — H40033 Anatomical narrow angle, bilateral: Secondary | ICD-10-CM | POA: Diagnosis not present

## 2016-04-14 DIAGNOSIS — G4700 Insomnia, unspecified: Secondary | ICD-10-CM | POA: Diagnosis not present

## 2016-04-14 DIAGNOSIS — Z0000 Encounter for general adult medical examination without abnormal findings: Secondary | ICD-10-CM | POA: Diagnosis not present

## 2016-04-14 DIAGNOSIS — G5601 Carpal tunnel syndrome, right upper limb: Secondary | ICD-10-CM | POA: Diagnosis not present

## 2016-04-14 DIAGNOSIS — K219 Gastro-esophageal reflux disease without esophagitis: Secondary | ICD-10-CM | POA: Diagnosis not present

## 2016-05-01 DIAGNOSIS — M545 Low back pain: Secondary | ICD-10-CM | POA: Diagnosis not present

## 2016-05-01 DIAGNOSIS — M9905 Segmental and somatic dysfunction of pelvic region: Secondary | ICD-10-CM | POA: Diagnosis not present

## 2016-05-01 DIAGNOSIS — M9903 Segmental and somatic dysfunction of lumbar region: Secondary | ICD-10-CM | POA: Diagnosis not present

## 2016-05-01 DIAGNOSIS — M9902 Segmental and somatic dysfunction of thoracic region: Secondary | ICD-10-CM | POA: Diagnosis not present

## 2016-05-02 DIAGNOSIS — M545 Low back pain: Secondary | ICD-10-CM | POA: Diagnosis not present

## 2016-05-02 DIAGNOSIS — M9903 Segmental and somatic dysfunction of lumbar region: Secondary | ICD-10-CM | POA: Diagnosis not present

## 2016-05-02 DIAGNOSIS — M9905 Segmental and somatic dysfunction of pelvic region: Secondary | ICD-10-CM | POA: Diagnosis not present

## 2016-05-02 DIAGNOSIS — M9902 Segmental and somatic dysfunction of thoracic region: Secondary | ICD-10-CM | POA: Diagnosis not present

## 2016-05-05 DIAGNOSIS — M545 Low back pain: Secondary | ICD-10-CM | POA: Diagnosis not present

## 2016-05-05 DIAGNOSIS — M9902 Segmental and somatic dysfunction of thoracic region: Secondary | ICD-10-CM | POA: Diagnosis not present

## 2016-05-05 DIAGNOSIS — M9905 Segmental and somatic dysfunction of pelvic region: Secondary | ICD-10-CM | POA: Diagnosis not present

## 2016-05-05 DIAGNOSIS — M9903 Segmental and somatic dysfunction of lumbar region: Secondary | ICD-10-CM | POA: Diagnosis not present

## 2016-05-09 DIAGNOSIS — M545 Low back pain: Secondary | ICD-10-CM | POA: Diagnosis not present

## 2016-05-09 DIAGNOSIS — M9905 Segmental and somatic dysfunction of pelvic region: Secondary | ICD-10-CM | POA: Diagnosis not present

## 2016-05-09 DIAGNOSIS — M9902 Segmental and somatic dysfunction of thoracic region: Secondary | ICD-10-CM | POA: Diagnosis not present

## 2016-05-09 DIAGNOSIS — M9903 Segmental and somatic dysfunction of lumbar region: Secondary | ICD-10-CM | POA: Diagnosis not present

## 2016-05-12 DIAGNOSIS — M545 Low back pain: Secondary | ICD-10-CM | POA: Diagnosis not present

## 2016-05-12 DIAGNOSIS — M9902 Segmental and somatic dysfunction of thoracic region: Secondary | ICD-10-CM | POA: Diagnosis not present

## 2016-05-12 DIAGNOSIS — M9905 Segmental and somatic dysfunction of pelvic region: Secondary | ICD-10-CM | POA: Diagnosis not present

## 2016-05-12 DIAGNOSIS — M9903 Segmental and somatic dysfunction of lumbar region: Secondary | ICD-10-CM | POA: Diagnosis not present

## 2016-05-16 DIAGNOSIS — M545 Low back pain: Secondary | ICD-10-CM | POA: Diagnosis not present

## 2016-05-16 DIAGNOSIS — M9903 Segmental and somatic dysfunction of lumbar region: Secondary | ICD-10-CM | POA: Diagnosis not present

## 2016-05-16 DIAGNOSIS — M9902 Segmental and somatic dysfunction of thoracic region: Secondary | ICD-10-CM | POA: Diagnosis not present

## 2016-05-16 DIAGNOSIS — M9905 Segmental and somatic dysfunction of pelvic region: Secondary | ICD-10-CM | POA: Diagnosis not present

## 2016-05-23 DIAGNOSIS — M9905 Segmental and somatic dysfunction of pelvic region: Secondary | ICD-10-CM | POA: Diagnosis not present

## 2016-05-23 DIAGNOSIS — G4700 Insomnia, unspecified: Secondary | ICD-10-CM | POA: Diagnosis not present

## 2016-05-23 DIAGNOSIS — K210 Gastro-esophageal reflux disease with esophagitis: Secondary | ICD-10-CM | POA: Diagnosis not present

## 2016-05-23 DIAGNOSIS — M9903 Segmental and somatic dysfunction of lumbar region: Secondary | ICD-10-CM | POA: Diagnosis not present

## 2016-05-23 DIAGNOSIS — M545 Low back pain: Secondary | ICD-10-CM | POA: Diagnosis not present

## 2016-05-23 DIAGNOSIS — M9902 Segmental and somatic dysfunction of thoracic region: Secondary | ICD-10-CM | POA: Diagnosis not present

## 2016-05-30 DIAGNOSIS — M9905 Segmental and somatic dysfunction of pelvic region: Secondary | ICD-10-CM | POA: Diagnosis not present

## 2016-05-30 DIAGNOSIS — M9903 Segmental and somatic dysfunction of lumbar region: Secondary | ICD-10-CM | POA: Diagnosis not present

## 2016-05-30 DIAGNOSIS — M9902 Segmental and somatic dysfunction of thoracic region: Secondary | ICD-10-CM | POA: Diagnosis not present

## 2016-05-30 DIAGNOSIS — M545 Low back pain: Secondary | ICD-10-CM | POA: Diagnosis not present

## 2016-07-21 DIAGNOSIS — M9905 Segmental and somatic dysfunction of pelvic region: Secondary | ICD-10-CM | POA: Diagnosis not present

## 2016-07-21 DIAGNOSIS — M5137 Other intervertebral disc degeneration, lumbosacral region: Secondary | ICD-10-CM | POA: Diagnosis not present

## 2016-07-21 DIAGNOSIS — M9903 Segmental and somatic dysfunction of lumbar region: Secondary | ICD-10-CM | POA: Diagnosis not present

## 2016-07-21 DIAGNOSIS — M9904 Segmental and somatic dysfunction of sacral region: Secondary | ICD-10-CM | POA: Diagnosis not present

## 2016-07-22 DIAGNOSIS — M9903 Segmental and somatic dysfunction of lumbar region: Secondary | ICD-10-CM | POA: Diagnosis not present

## 2016-07-22 DIAGNOSIS — M9905 Segmental and somatic dysfunction of pelvic region: Secondary | ICD-10-CM | POA: Diagnosis not present

## 2016-07-22 DIAGNOSIS — M9904 Segmental and somatic dysfunction of sacral region: Secondary | ICD-10-CM | POA: Diagnosis not present

## 2016-07-22 DIAGNOSIS — M5137 Other intervertebral disc degeneration, lumbosacral region: Secondary | ICD-10-CM | POA: Diagnosis not present

## 2016-07-23 DIAGNOSIS — M9905 Segmental and somatic dysfunction of pelvic region: Secondary | ICD-10-CM | POA: Diagnosis not present

## 2016-07-23 DIAGNOSIS — M5137 Other intervertebral disc degeneration, lumbosacral region: Secondary | ICD-10-CM | POA: Diagnosis not present

## 2016-07-23 DIAGNOSIS — M9903 Segmental and somatic dysfunction of lumbar region: Secondary | ICD-10-CM | POA: Diagnosis not present

## 2016-07-23 DIAGNOSIS — M9904 Segmental and somatic dysfunction of sacral region: Secondary | ICD-10-CM | POA: Diagnosis not present

## 2016-07-24 DIAGNOSIS — M9903 Segmental and somatic dysfunction of lumbar region: Secondary | ICD-10-CM | POA: Diagnosis not present

## 2016-07-24 DIAGNOSIS — M9904 Segmental and somatic dysfunction of sacral region: Secondary | ICD-10-CM | POA: Diagnosis not present

## 2016-07-24 DIAGNOSIS — M9905 Segmental and somatic dysfunction of pelvic region: Secondary | ICD-10-CM | POA: Diagnosis not present

## 2016-07-24 DIAGNOSIS — M5137 Other intervertebral disc degeneration, lumbosacral region: Secondary | ICD-10-CM | POA: Diagnosis not present

## 2016-07-28 DIAGNOSIS — M9904 Segmental and somatic dysfunction of sacral region: Secondary | ICD-10-CM | POA: Diagnosis not present

## 2016-07-28 DIAGNOSIS — M9905 Segmental and somatic dysfunction of pelvic region: Secondary | ICD-10-CM | POA: Diagnosis not present

## 2016-07-28 DIAGNOSIS — M9903 Segmental and somatic dysfunction of lumbar region: Secondary | ICD-10-CM | POA: Diagnosis not present

## 2016-07-28 DIAGNOSIS — M5137 Other intervertebral disc degeneration, lumbosacral region: Secondary | ICD-10-CM | POA: Diagnosis not present

## 2016-07-29 DIAGNOSIS — M9905 Segmental and somatic dysfunction of pelvic region: Secondary | ICD-10-CM | POA: Diagnosis not present

## 2016-07-29 DIAGNOSIS — M9904 Segmental and somatic dysfunction of sacral region: Secondary | ICD-10-CM | POA: Diagnosis not present

## 2016-07-29 DIAGNOSIS — M5137 Other intervertebral disc degeneration, lumbosacral region: Secondary | ICD-10-CM | POA: Diagnosis not present

## 2016-07-29 DIAGNOSIS — M9903 Segmental and somatic dysfunction of lumbar region: Secondary | ICD-10-CM | POA: Diagnosis not present

## 2016-07-30 DIAGNOSIS — M9904 Segmental and somatic dysfunction of sacral region: Secondary | ICD-10-CM | POA: Diagnosis not present

## 2016-07-30 DIAGNOSIS — M5137 Other intervertebral disc degeneration, lumbosacral region: Secondary | ICD-10-CM | POA: Diagnosis not present

## 2016-07-30 DIAGNOSIS — M9903 Segmental and somatic dysfunction of lumbar region: Secondary | ICD-10-CM | POA: Diagnosis not present

## 2016-07-30 DIAGNOSIS — M9905 Segmental and somatic dysfunction of pelvic region: Secondary | ICD-10-CM | POA: Diagnosis not present

## 2016-07-31 DIAGNOSIS — M5137 Other intervertebral disc degeneration, lumbosacral region: Secondary | ICD-10-CM | POA: Diagnosis not present

## 2016-07-31 DIAGNOSIS — M9903 Segmental and somatic dysfunction of lumbar region: Secondary | ICD-10-CM | POA: Diagnosis not present

## 2016-07-31 DIAGNOSIS — M9905 Segmental and somatic dysfunction of pelvic region: Secondary | ICD-10-CM | POA: Diagnosis not present

## 2016-07-31 DIAGNOSIS — M9904 Segmental and somatic dysfunction of sacral region: Secondary | ICD-10-CM | POA: Diagnosis not present

## 2016-08-04 DIAGNOSIS — M9904 Segmental and somatic dysfunction of sacral region: Secondary | ICD-10-CM | POA: Diagnosis not present

## 2016-08-04 DIAGNOSIS — M9903 Segmental and somatic dysfunction of lumbar region: Secondary | ICD-10-CM | POA: Diagnosis not present

## 2016-08-04 DIAGNOSIS — M9905 Segmental and somatic dysfunction of pelvic region: Secondary | ICD-10-CM | POA: Diagnosis not present

## 2016-08-04 DIAGNOSIS — M5137 Other intervertebral disc degeneration, lumbosacral region: Secondary | ICD-10-CM | POA: Diagnosis not present

## 2016-08-06 DIAGNOSIS — M5137 Other intervertebral disc degeneration, lumbosacral region: Secondary | ICD-10-CM | POA: Diagnosis not present

## 2016-08-06 DIAGNOSIS — M9905 Segmental and somatic dysfunction of pelvic region: Secondary | ICD-10-CM | POA: Diagnosis not present

## 2016-08-06 DIAGNOSIS — M9904 Segmental and somatic dysfunction of sacral region: Secondary | ICD-10-CM | POA: Diagnosis not present

## 2016-08-06 DIAGNOSIS — M9903 Segmental and somatic dysfunction of lumbar region: Secondary | ICD-10-CM | POA: Diagnosis not present

## 2016-08-07 DIAGNOSIS — M5137 Other intervertebral disc degeneration, lumbosacral region: Secondary | ICD-10-CM | POA: Diagnosis not present

## 2016-08-07 DIAGNOSIS — M9904 Segmental and somatic dysfunction of sacral region: Secondary | ICD-10-CM | POA: Diagnosis not present

## 2016-08-07 DIAGNOSIS — M9903 Segmental and somatic dysfunction of lumbar region: Secondary | ICD-10-CM | POA: Diagnosis not present

## 2016-08-07 DIAGNOSIS — M9905 Segmental and somatic dysfunction of pelvic region: Secondary | ICD-10-CM | POA: Diagnosis not present

## 2016-08-11 DIAGNOSIS — M9904 Segmental and somatic dysfunction of sacral region: Secondary | ICD-10-CM | POA: Diagnosis not present

## 2016-08-11 DIAGNOSIS — M9905 Segmental and somatic dysfunction of pelvic region: Secondary | ICD-10-CM | POA: Diagnosis not present

## 2016-08-11 DIAGNOSIS — M5137 Other intervertebral disc degeneration, lumbosacral region: Secondary | ICD-10-CM | POA: Diagnosis not present

## 2016-08-11 DIAGNOSIS — M9903 Segmental and somatic dysfunction of lumbar region: Secondary | ICD-10-CM | POA: Diagnosis not present

## 2016-08-13 DIAGNOSIS — M9905 Segmental and somatic dysfunction of pelvic region: Secondary | ICD-10-CM | POA: Diagnosis not present

## 2016-08-13 DIAGNOSIS — M9904 Segmental and somatic dysfunction of sacral region: Secondary | ICD-10-CM | POA: Diagnosis not present

## 2016-08-13 DIAGNOSIS — M5137 Other intervertebral disc degeneration, lumbosacral region: Secondary | ICD-10-CM | POA: Diagnosis not present

## 2016-08-13 DIAGNOSIS — M9903 Segmental and somatic dysfunction of lumbar region: Secondary | ICD-10-CM | POA: Diagnosis not present

## 2016-08-14 DIAGNOSIS — M9905 Segmental and somatic dysfunction of pelvic region: Secondary | ICD-10-CM | POA: Diagnosis not present

## 2016-08-14 DIAGNOSIS — M5137 Other intervertebral disc degeneration, lumbosacral region: Secondary | ICD-10-CM | POA: Diagnosis not present

## 2016-08-14 DIAGNOSIS — M9903 Segmental and somatic dysfunction of lumbar region: Secondary | ICD-10-CM | POA: Diagnosis not present

## 2016-08-14 DIAGNOSIS — M9904 Segmental and somatic dysfunction of sacral region: Secondary | ICD-10-CM | POA: Diagnosis not present

## 2016-08-18 DIAGNOSIS — M9904 Segmental and somatic dysfunction of sacral region: Secondary | ICD-10-CM | POA: Diagnosis not present

## 2016-08-18 DIAGNOSIS — M9903 Segmental and somatic dysfunction of lumbar region: Secondary | ICD-10-CM | POA: Diagnosis not present

## 2016-08-18 DIAGNOSIS — M5137 Other intervertebral disc degeneration, lumbosacral region: Secondary | ICD-10-CM | POA: Diagnosis not present

## 2016-08-18 DIAGNOSIS — M9905 Segmental and somatic dysfunction of pelvic region: Secondary | ICD-10-CM | POA: Diagnosis not present

## 2016-08-20 DIAGNOSIS — M9903 Segmental and somatic dysfunction of lumbar region: Secondary | ICD-10-CM | POA: Diagnosis not present

## 2016-08-20 DIAGNOSIS — M5137 Other intervertebral disc degeneration, lumbosacral region: Secondary | ICD-10-CM | POA: Diagnosis not present

## 2016-08-20 DIAGNOSIS — M9904 Segmental and somatic dysfunction of sacral region: Secondary | ICD-10-CM | POA: Diagnosis not present

## 2016-08-20 DIAGNOSIS — M9905 Segmental and somatic dysfunction of pelvic region: Secondary | ICD-10-CM | POA: Diagnosis not present

## 2016-08-21 DIAGNOSIS — M9903 Segmental and somatic dysfunction of lumbar region: Secondary | ICD-10-CM | POA: Diagnosis not present

## 2016-08-21 DIAGNOSIS — M5137 Other intervertebral disc degeneration, lumbosacral region: Secondary | ICD-10-CM | POA: Diagnosis not present

## 2016-08-21 DIAGNOSIS — M9905 Segmental and somatic dysfunction of pelvic region: Secondary | ICD-10-CM | POA: Diagnosis not present

## 2016-08-21 DIAGNOSIS — M9904 Segmental and somatic dysfunction of sacral region: Secondary | ICD-10-CM | POA: Diagnosis not present
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
## 2016-10-16 DIAGNOSIS — K210 Gastro-esophageal reflux disease with esophagitis: Secondary | ICD-10-CM | POA: Diagnosis not present

## 2016-10-16 DIAGNOSIS — M545 Low back pain: Secondary | ICD-10-CM | POA: Diagnosis not present

## 2016-10-16 DIAGNOSIS — G4700 Insomnia, unspecified: Secondary | ICD-10-CM | POA: Diagnosis not present

## 2016-10-20 DIAGNOSIS — H40033 Anatomical narrow angle, bilateral: Secondary | ICD-10-CM | POA: Diagnosis not present

## 2016-10-20 DIAGNOSIS — H04123 Dry eye syndrome of bilateral lacrimal glands: Secondary | ICD-10-CM | POA: Diagnosis not present

## 2016-12-05 DIAGNOSIS — I469 Cardiac arrest, cause unspecified: Secondary | ICD-10-CM | POA: Diagnosis not present

## 2016-12-06 DIAGNOSIS — 419620001 Death: Secondary | SNOMED CT | POA: Diagnosis not present

## 2016-12-06 DEATH — deceased

## 2017-01-06 DEATH — deceased

## 2017-08-26 ENCOUNTER — Encounter: Payer: Self-pay | Admitting: Internal Medicine
